# Patient Record
Sex: Female | Born: 1935 | Race: Black or African American | Hispanic: No | Smoking: Never smoker
Health system: Southern US, Community
[De-identification: ages and names within clinical notes are randomized; demographics above are authoritative.]

## PROBLEM LIST (undated history)

## (undated) DIAGNOSIS — I10 Essential (primary) hypertension: Secondary | ICD-10-CM

## (undated) DIAGNOSIS — M810 Age-related osteoporosis without current pathological fracture: Secondary | ICD-10-CM

## (undated) DIAGNOSIS — E079 Disorder of thyroid, unspecified: Secondary | ICD-10-CM

## (undated) DIAGNOSIS — E785 Hyperlipidemia, unspecified: Secondary | ICD-10-CM

## (undated) HISTORY — PX: TUBAL LIGATION: SHX77

## (undated) HISTORY — PX: KNEE SURGERY: SHX244

---

## 2004-07-26 ENCOUNTER — Ambulatory Visit: Payer: Self-pay | Admitting: Family Medicine

## 2005-12-17 ENCOUNTER — Ambulatory Visit: Payer: Self-pay | Admitting: Unknown Physician Specialty

## 2006-01-28 ENCOUNTER — Ambulatory Visit: Payer: Self-pay | Admitting: Family Medicine

## 2006-05-24 ENCOUNTER — Inpatient Hospital Stay: Payer: Self-pay | Admitting: Internal Medicine

## 2006-05-24 ENCOUNTER — Other Ambulatory Visit: Payer: Self-pay

## 2006-05-25 ENCOUNTER — Ambulatory Visit: Payer: Self-pay | Admitting: Internal Medicine

## 2006-06-07 ENCOUNTER — Ambulatory Visit: Payer: Self-pay | Admitting: Internal Medicine

## 2006-07-03 ENCOUNTER — Ambulatory Visit: Payer: Self-pay | Admitting: Internal Medicine

## 2006-07-07 ENCOUNTER — Ambulatory Visit: Payer: Self-pay | Admitting: Internal Medicine

## 2006-08-07 ENCOUNTER — Ambulatory Visit: Payer: Self-pay | Admitting: Internal Medicine

## 2006-08-10 ENCOUNTER — Ambulatory Visit: Payer: Self-pay | Admitting: Internal Medicine

## 2006-09-07 ENCOUNTER — Ambulatory Visit: Payer: Self-pay | Admitting: Internal Medicine

## 2007-03-04 ENCOUNTER — Ambulatory Visit: Payer: Self-pay | Admitting: Family Medicine

## 2007-07-02 ENCOUNTER — Ambulatory Visit: Payer: Self-pay | Admitting: Family Medicine

## 2008-03-06 ENCOUNTER — Ambulatory Visit: Payer: Self-pay | Admitting: Family Medicine

## 2009-04-18 ENCOUNTER — Ambulatory Visit: Payer: Self-pay | Admitting: Family Medicine

## 2010-05-08 ENCOUNTER — Ambulatory Visit: Payer: Self-pay | Admitting: Family Medicine

## 2011-01-27 ENCOUNTER — Emergency Department: Payer: Self-pay | Admitting: Emergency Medicine

## 2011-01-27 LAB — URINALYSIS, COMPLETE
Bacteria: NONE SEEN
Bilirubin,UR: NEGATIVE
Blood: NEGATIVE
Glucose,UR: NEGATIVE mg/dL (ref 0–75)
Hyaline Cast: 1
Ketone: NEGATIVE
Leukocyte Esterase: NEGATIVE
Nitrite: NEGATIVE
Ph: 5 (ref 4.5–8.0)
Protein: NEGATIVE
RBC,UR: 1 /HPF (ref 0–5)
Specific Gravity: 1.02 (ref 1.003–1.030)
Squamous Epithelial: 2
WBC UR: 1 /HPF (ref 0–5)

## 2011-01-27 LAB — CBC
HCT: 39.4 % (ref 35.0–47.0)
HGB: 12.8 g/dL (ref 12.0–16.0)
MCH: 29 pg (ref 26.0–34.0)
MCHC: 32.4 g/dL (ref 32.0–36.0)
MCV: 90 fL (ref 80–100)
Platelet: 157 10*3/uL (ref 150–440)
RBC: 4.4 10*6/uL (ref 3.80–5.20)
RDW: 16.5 % — ABNORMAL HIGH (ref 11.5–14.5)
WBC: 3.9 10*3/uL (ref 3.6–11.0)

## 2011-01-27 LAB — BASIC METABOLIC PANEL
Anion Gap: 9 (ref 7–16)
BUN: 6 mg/dL — ABNORMAL LOW (ref 7–18)
Calcium, Total: 8.4 mg/dL — ABNORMAL LOW (ref 8.5–10.1)
Chloride: 105 mmol/L (ref 98–107)
Co2: 25 mmol/L (ref 21–32)
Creatinine: 0.4 mg/dL — ABNORMAL LOW (ref 0.60–1.30)
EGFR (African American): >60
EGFR (Non-African Amer.): >60
Glucose: 76 mg/dL (ref 65–99)
Osmolality: 274 (ref 275–301)
Potassium: 4.2 mmol/L (ref 3.5–5.1)
Sodium: 139 mmol/L (ref 136–145)

## 2012-01-01 ENCOUNTER — Ambulatory Visit: Payer: Self-pay | Admitting: Family Medicine

## 2013-01-04 ENCOUNTER — Ambulatory Visit: Payer: Self-pay | Admitting: Family Medicine

## 2014-03-10 ENCOUNTER — Ambulatory Visit: Payer: Self-pay | Admitting: Family Medicine

## 2015-11-05 ENCOUNTER — Encounter: Payer: Self-pay | Admitting: Emergency Medicine

## 2015-11-05 ENCOUNTER — Inpatient Hospital Stay
Admission: EM | Admit: 2015-11-05 | Discharge: 2015-11-06 | DRG: 812 | Disposition: A | Discharge status: Home / Self Care | Payer: Medicare Other | Attending: Internal Medicine | Admitting: Internal Medicine

## 2015-11-05 DIAGNOSIS — E785 Hyperlipidemia, unspecified: Secondary | ICD-10-CM | POA: Diagnosis present

## 2015-11-05 DIAGNOSIS — D509 Iron deficiency anemia, unspecified: Secondary | ICD-10-CM | POA: Diagnosis present

## 2015-11-05 DIAGNOSIS — I10 Essential (primary) hypertension: Secondary | ICD-10-CM | POA: Diagnosis present

## 2015-11-05 DIAGNOSIS — E039 Hypothyroidism, unspecified: Secondary | ICD-10-CM | POA: Diagnosis present

## 2015-11-05 DIAGNOSIS — R42 Dizziness and giddiness: Secondary | ICD-10-CM | POA: Diagnosis present

## 2015-11-05 DIAGNOSIS — M810 Age-related osteoporosis without current pathological fracture: Secondary | ICD-10-CM | POA: Diagnosis present

## 2015-11-05 DIAGNOSIS — D649 Anemia, unspecified: Secondary | ICD-10-CM | POA: Diagnosis present

## 2015-11-05 HISTORY — DX: Age-related osteoporosis without current pathological fracture: M81.0

## 2015-11-05 HISTORY — DX: Hyperlipidemia, unspecified: E78.5

## 2015-11-05 HISTORY — DX: Essential (primary) hypertension: I10

## 2015-11-05 HISTORY — DX: Disorder of thyroid, unspecified: E07.9

## 2015-11-05 LAB — ABO/RH: ABO/RH(D): O NEG

## 2015-11-05 LAB — BASIC METABOLIC PANEL
ANION GAP: 3 — AB (ref 5–15)
BUN: 17 mg/dL (ref 6–20)
CHLORIDE: 107 mmol/L (ref 101–111)
CO2: 28 mmol/L (ref 22–32)
Calcium: 10.5 mg/dL — ABNORMAL HIGH (ref 8.9–10.3)
Creatinine, Ser: 0.64 mg/dL (ref 0.44–1.00)
GFR calc non Af Amer: >60 mL/min (ref >60)
Glucose, Bld: 108 mg/dL — ABNORMAL HIGH (ref 65–99)
POTASSIUM: 3.2 mmol/L — AB (ref 3.5–5.1)
Sodium: 138 mmol/L (ref 135–145)

## 2015-11-05 LAB — IRON AND TIBC
Iron: 11 ug/dL — ABNORMAL LOW (ref 28–170)
Saturation Ratios: 3 % — ABNORMAL LOW (ref 10.4–31.8)
TIBC: 422 ug/dL (ref 250–450)
UIBC: 411 ug/dL

## 2015-11-05 LAB — CBC
HCT: 24.7 % — ABNORMAL LOW (ref 35.0–47.0)
HEMOGLOBIN: 7.4 g/dL — AB (ref 12.0–16.0)
MCH: 20.1 pg — AB (ref 26.0–34.0)
MCHC: 29.9 g/dL — ABNORMAL LOW (ref 32.0–36.0)
MCV: 67.2 fL — AB (ref 80.0–100.0)
Platelets: 179 10*3/uL (ref 150–440)
RBC: 3.68 MIL/uL — AB (ref 3.80–5.20)
RDW: 20.4 % — ABNORMAL HIGH (ref 11.5–14.5)
WBC: 3.2 10*3/uL — AB (ref 3.6–11.0)

## 2015-11-05 LAB — LACTATE DEHYDROGENASE: LDH: 208 U/L — ABNORMAL HIGH (ref 98–192)

## 2015-11-05 LAB — FERRITIN: FERRITIN: 6 ng/mL — AB (ref 11–307)

## 2015-11-05 LAB — RETICULOCYTES
RBC.: 3.73 MIL/uL — ABNORMAL LOW (ref 3.80–5.20)
RETIC CT PCT: 1.5 % (ref 0.4–3.1)
Retic Count, Absolute: 56 10*3/uL (ref 19.0–183.0)

## 2015-11-05 LAB — PREPARE RBC (CROSSMATCH)

## 2015-11-05 LAB — FOLATE: Folate: 22 ng/mL (ref >5.9)

## 2015-11-05 LAB — TROPONIN I: Troponin I: <0.03 ng/mL (ref <0.03)

## 2015-11-05 MED ORDER — METOPROLOL SUCCINATE ER 50 MG PO TB24
50.0000 mg | ORAL_TABLET | Freq: Every day | ORAL | Status: DC
  Administered 2015-11-06: 50 mg via ORAL
  Filled 2015-11-05: qty 1

## 2015-11-05 MED ORDER — POTASSIUM CHLORIDE CRYS ER 20 MEQ PO TBCR
40.0000 meq | EXTENDED_RELEASE_TABLET | Freq: Once | ORAL | Status: AC
Start: 2015-11-05 — End: 2015-11-06
  Administered 2015-11-06: 40 meq via ORAL
  Filled 2015-11-05: qty 2

## 2015-11-05 MED ORDER — ALENDRONATE SODIUM 70 MG PO TABS: 70.0000 mg | ORAL_TABLET | ORAL | Status: DC

## 2015-11-05 MED ORDER — LISINOPRIL 40 MG PO TABS
40.0000 mg | ORAL_TABLET | Freq: Every day | ORAL | Status: DC
  Administered 2015-11-06: 40 mg via ORAL
  Filled 2015-11-05: qty 1

## 2015-11-05 MED ORDER — SIMVASTATIN 20 MG PO TABS
40.0000 mg | ORAL_TABLET | Freq: Every day | ORAL | Status: DC
  Administered 2015-11-06: 40 mg via ORAL
  Filled 2015-11-05: qty 2

## 2015-11-05 MED ORDER — ACETAMINOPHEN 325 MG PO TABS
650.0000 mg | ORAL_TABLET | Freq: Four times a day (QID) | ORAL | Status: DC | PRN
  Administered 2015-11-05: 650 mg via ORAL
  Filled 2015-11-05: qty 2

## 2015-11-05 MED ORDER — VITAMIN D 1000 UNITS PO TABS
2000.0000 [IU] | ORAL_TABLET | Freq: Every day | ORAL | Status: DC
  Administered 2015-11-06: 2000 [IU] via ORAL
  Filled 2015-11-05: qty 2

## 2015-11-05 MED ORDER — LEVOTHYROXINE SODIUM 75 MCG PO TABS
75.0000 ug | ORAL_TABLET | Freq: Every day | ORAL | Status: DC
  Administered 2015-11-06: 75 ug via ORAL
  Filled 2015-11-05: qty 1

## 2015-11-05 MED ORDER — ACETAMINOPHEN 650 MG RE SUPP
650.0000 mg | Freq: Four times a day (QID) | RECTAL | Status: DC | PRN
  Filled 2015-11-05: qty 1

## 2015-11-05 MED ORDER — SODIUM CHLORIDE 0.9 % IV SOLN
10.0000 mL/h | Freq: Once | INTRAVENOUS | Status: AC
  Administered 2015-11-05: 10 mL/h via INTRAVENOUS

## 2015-11-05 NOTE — ED Triage Notes (Signed)
Pt was at Dr and sent to the Ed. Hemoglobin results were low, pt was feeling dizzy earlier in the day. Lab work completed today

## 2015-11-05 NOTE — ED Provider Notes (Signed)
[facility]-EMERGENCY DEPARTMENT Provider Note   CSN: [id number] Arrival date & time: 11/05/15  1649     History   Chief Complaint Chief Complaint  Patient presents with  . Anemia    HPI Cristina Coleman is a 82 y.o. female hx of HL, HTN, hypothyroidism, here with near syncope, symptomatic anemia. Patient has a history of B12 deficiency. Has been lightheaded and dizzy for the last 2 weeks or so. States that is constantly getting worse. She denies any chest pain or shortness of breath. She denies any abdominal pain or black stools. Not currently on any blood thinners and no history of GI bleeds. Patient went to see primary care doctor and was noted to have a hemoglobin of 7.3. She was sent here for symptomatic anemia.    The history is provided by the patient.    Past Medical History:  Diagnosis Date  . Hyperlipidemia   . Hypertension   . Thyroid disease     There are no active problems to display for this patient.   Past Surgical History:  Procedure Laterality Date  . KNEE SURGERY    . TUBAL LIGATION      OB History    No data available       Home Medications    Prior to Admission medications   Not on File    Family History No family history on file.  Social History Social History  Substance Use Topics  . Smoking status: Never Smoker  . Smokeless tobacco: Never Used  . Alcohol use No     Allergies   Review of patient's allergies indicates no known allergies.   Review of Systems Review of Systems  Neurological: Positive for dizziness.  All other systems reviewed and are negative.    Physical Exam Updated Vital Signs BP (!) 176/91 (BP Location: Left Arm)   Pulse 89   Temp 98.1 F (36.7 C) (Oral)   Resp 16   Ht 5\' 3"  (1.6 m)   Wt 135 lb (61.2 kg)   SpO2 99%   BMI 23.91 kg/m   Physical Exam  Constitutional:  Chronically ill, pale   HENT:  Head: Normocephalic.  Eyes: EOM are normal. Pupils are equal, round, and reactive to light.    Conjunctiva pale   Neck: Normal range of motion. Neck supple.  Cardiovascular: Normal rate, regular rhythm and normal heart sounds.   Pulmonary/Chest: Effort normal and breath sounds normal. No respiratory distress. She has no wheezes. She has no rales.  Abdominal: Soft. Bowel sounds are normal. She exhibits no distension. There is no tenderness. There is no guarding.  Genitourinary:  Genitourinary Comments: occ neg, brown stool   Musculoskeletal: Normal range of motion.  Neurological: She is alert.  Skin: Skin is warm.  Psychiatric: She has a normal mood and affect.  Nursing note and vitals reviewed.    ED Treatments / Results  Labs (all labs ordered are listed, but only abnormal results are displayed) Labs Reviewed  BASIC METABOLIC PANEL - Abnormal; Notable for the following:       Result Value   Potassium 3.2 (*)    Glucose, Bld 108 (*)    Calcium 10.5 (*)    Anion gap 3 (*)    All other components within normal limits  CBC - Abnormal; Notable for the following:    WBC 3.2 (*)    RBC 3.68 (*)    Hemoglobin 7.4 (*)    HCT 24.7 (*)    MCV 67.2 (*)  MCH 20.1 (*)    MCHC 29.9 (*)    RDW 20.4 (*)    All other components within normal limits  URINALYSIS COMPLETEWITH MICROSCOPIC ([facility] ONLY)  VITAMIN B12  FOLATE  IRON AND TIBC  FERRITIN  RETICULOCYTES  TROPONIN I  CBG MONITORING, ED  TYPE AND SCREEN  PREPARE RBC (CROSSMATCH)    EKG  EKG Interpretation None       ED ECG REPORT I, Richardean Canalavid H Yao, the attending physician, personally viewed and interpreted this ECG.   Date: 11/05/2015  EKG Time: 18:46 pm  Rate: 72  Rhythm: normal EKG, normal sinus rhythm  Axis: normal  Intervals:left anterior fascicular block  ST&T Change: nonspecific    Radiology No results found.  Procedures Procedures (including critical care time)   CRITICAL CARE Performed by: Richardean Canalavid H Yao   Total critical care time: 30 minutes  Critical care time was exclusive of separately  billable procedures and treating other patients.  Critical care was necessary to treat or prevent imminent or life-threatening deterioration.  Critical care was time spent personally by me on the following activities: development of treatment plan with patient and/or surrogate as well as nursing, discussions with consultants, evaluation of patient's response to treatment, examination of patient, obtaining history from patient or surrogate, ordering and performing treatments and interventions, ordering and review of laboratory studies, ordering and review of radiographic studies, pulse oximetry and re-evaluation of patient's condition.   Medications Ordered in ED Medications  0.9 %  sodium chloride infusion (not administered)     Initial Impression / Assessment and Plan / ED Course  I have reviewed the triage vital signs and the nursing notes.  Pertinent labs & imaging results that were available during my care of the patient were reviewed by me and considered in my medical decision making (see chart for details).  Clinical Course    Cristina Coleman is a 82 y.o. female here with symptomatic anemia. Hg 7.4 in the ED. Occ negative, brown stool. Abdomen nontender. Has hx of B12 deficiency but has microcytic anemia currently. Since she is symptomatic, will order 1 U PRBC. Ordered anemia panel to evaluate for the cause of her anemia. Patient consented to blood transfusion. Will admit.    Final Clinical Impressions(s) / ED Diagnoses   Final diagnoses:  None    New Prescriptions New Prescriptions   No medications on file     Charlynne Panderavid Hsienta Yao, MD 11/05/15 1902

## 2015-11-05 NOTE — H&P (Signed)
[facility] [place] [facility]   PATIENT NAME: Cristina MayhewMary Schendel    MR#:  [id number]  DATE OF BIRTH:  06-25-1924  DATE OF ADMISSION:  11/05/2015  PRIMARY CARE PHYSICIAN: Dorothey BasemanAVID BRONSTEIN, MD   REQUESTING/REFERRING PHYSICIAN: Dr Chaney Mallingavid Yao  CHIEF COMPLAINT:   Chief Complaint  Patient presents with  . Anemia    HISTORY OF PRESENT ILLNESS:  Cristina Coleman  is a 82 y.o. female that went to see her primary care physician today because of dizziness and no energy. She had some laboratory data sent to the lab and they were called to come to the ER for blood transfusion. Patient does have occasional shortness of breath and weakness. The ER physician ordered a unit of blood for a hemoglobin of 7.3 for symptomatic anemia. Patient has no complaints of chest pain. The patient was guaiac negative in the ER.  PAST MEDICAL HISTORY:   Past Medical History:  Diagnosis Date  . Hyperlipidemia   . Hypertension   . Osteoporosis   . Thyroid disease     PAST SURGICAL HISTORY:   Past Surgical History:  Procedure Laterality Date  . KNEE SURGERY    . TUBAL LIGATION      SOCIAL HISTORY:   Social History  Substance Use Topics  . Smoking status: Never Smoker  . Smokeless tobacco: Never Used  . Alcohol use No    FAMILY HISTORY:   Family History  Problem Relation Age of Onset  . Ovarian cancer Mother     DRUG ALLERGIES:  No Known Allergies  REVIEW OF SYSTEMS:  CONSTITUTIONAL: No fever. Positive for weakness.  EYES: No blurred or double vision.  EARS, NOSE, AND THROAT: No tinnitus or ear pain. Positive for sore throat. Positive for runny nose. Occasional dysphasia RESPIRATORY: No cough. Positive for occasional shortness of breath. No wheezing or hemoptysis.  CARDIOVASCULAR: No chest pain, orthopnea, edema.  GASTROINTESTINAL: No nausea, vomiting, diarrhea or abdominal pain. No blood in bowel movements GENITOURINARY: No dysuria, hematuria.  ENDOCRINE: No  polyuria, nocturia,  HEMATOLOGY: History of anemia.  SKIN: No rash or lesion. MUSCULOSKELETAL: No joint pain or arthritis.   NEUROLOGIC: No tingling, numbness, weakness.  PSYCHIATRY: No anxiety or depression.   MEDICATIONS AT HOME:   Prior to Admission medications   Medication Sig Start Date End Date Taking? Authorizing Provider  alendronate (FOSAMAX) 70 MG tablet Take 1 tablet by mouth once a week. 10/30/15  Yes Historical Provider, MD  aspirin EC 81 MG tablet Take 81 mg by mouth daily as needed.   Yes Historical Provider, MD  Cholecalciferol (VITAMIN D) 2000 units tablet Take 1 tablet by mouth daily. 07/19/15 07/18/16 Yes Historical Provider, MD  levothyroxine (SYNTHROID, LEVOTHROID) 75 MCG tablet Take 1 tablet by mouth daily. 10/16/15  Yes Historical Provider, MD  lisinopril (PRINIVIL,ZESTRIL) 40 MG tablet Take 1 tablet by mouth daily. 10/29/15  Yes Historical Provider, MD  metoprolol succinate (TOPROL-XL) 50 MG 24 hr tablet Take 1 tablet by mouth daily. 10/17/15  Yes Historical Provider, MD  simvastatin (ZOCOR) 40 MG tablet Take 40 mg by mouth at bedtime. 10/16/15  Yes Historical Provider, MD      VITAL SIGNS:  Blood pressure (!) 169/75, pulse 73, temperature 98.1 F (36.7 C), temperature source Oral, resp. rate 19, height 5\' 3"  (1.6 m), weight 61.2 kg (135 lb), SpO2 100 %.  PHYSICAL EXAMINATION:  GENERAL:  82 y.o.-year-old patient lying in the bed with no acute distress.  EYES: Pupils equal, round, reactive to  light and accommodation. No scleral icterus. Extraocular muscles intact. Conjunctiva pale HEENT: Head atraumatic, normocephalic. Oropharynx and nasopharynx clear.  NECK:  Supple, no jugular venous distention. No thyroid enlargement, no tenderness.  LUNGS: Normal breath sounds bilaterally, no wheezing, rales,rhonchi or crepitation. No use of accessory muscles of respiration.  CARDIOVASCULAR: S1, S2 normal. 2/6 systolic murmurs. No rubs, or gallops.  ABDOMEN: Soft, nontender,  nondistended. Bowel sounds present. No organomegaly or mass.  EXTREMITIES: No pedal edema, cyanosis, or clubbing.  NEUROLOGIC: Cranial nerves II through XII are intact. Muscle strength 5/5 in all extremities. Sensation intact. Gait not checked.  PSYCHIATRIC: The patient is alert and oriented x 3.  SKIN: No rash, lesion, or ulcer.   LABORATORY PANEL:   CBC  Recent Labs Lab 11/05/15 1705  WBC 3.2*  HGB 7.4*  HCT 24.7*  PLT 179   ------------------------------------------------------------------------------------------------------------------  Chemistries   Recent Labs Lab 11/05/15 1705  NA 138  K 3.2*  CL 107  CO2 28  GLUCOSE 108*  BUN 17  CREATININE 0.64  CALCIUM 10.5*   ------------------------------------------------------------------------------------------------------------------    EKG:   Normal sinus rhythm 72 bpm, atrial premature complex, left anterior fascicular block  IMPRESSION AND PLAN:   1. Symptomatic anemia with hemoglobin of 7.3. Looking back at old labs the patient did have a hemoglobin of above 10 a few months ago. Patient guaiac negative in the ER. Iron studies sent off and still pending at this time. I will also send off a haptoglobin and LDH. Depending on where the ferritin level is will depend on whether the patient needs a GI workup or another workup. If the ferritin is very low can consider IV venofer. Patient advised to stop any aspirin or anti-inflammatories. 2. Essential hypertension. Continue her usual medications. Patient orthostatic in the ER. The unit of blood should help this. Continue to check orthostatics. 3. Hypothyroidism unspecified continue levothyroxine 4. Osteoporosis. On Fosamax as outpatient 5. Hyperlipidemia unspecified on simvastatin  All the records are reviewed and case discussed with ED provider. Management plans discussed with the patient, family and they are in agreement.  CODE STATUS: Full code  TOTAL TIME TAKING  CARE OF THIS PATIENT: 50 minutes.    Alford HighlandWIETING, Nayleah Gamel M.D on 11/05/2015 at 8:30 PM  Between 7am to 6pm - Pager - [phone]  After 6pm call admission pager [phone]  [facility] Office  [phone]  CC: Primary care physician; Dorothey BasemanAVID BRONSTEIN, MD

## 2015-11-06 LAB — CBC
HCT: 26.4 % — ABNORMAL LOW (ref 35.0–47.0)
HEMOGLOBIN: 8.2 g/dL — AB (ref 12.0–16.0)
MCH: 21.7 pg — ABNORMAL LOW (ref 26.0–34.0)
MCHC: 31.1 g/dL — ABNORMAL LOW (ref 32.0–36.0)
MCV: 69.8 fL — AB (ref 80.0–100.0)
PLATELETS: 153 10*3/uL (ref 150–440)
RBC: 3.78 MIL/uL — AB (ref 3.80–5.20)
RDW: 21.3 % — ABNORMAL HIGH (ref 11.5–14.5)
WBC: 3.3 10*3/uL — AB (ref 3.6–11.0)

## 2015-11-06 LAB — TYPE AND SCREEN
ABO/RH(D): O NEG
Antibody Screen: NEGATIVE
Unit division: 0

## 2015-11-06 LAB — BASIC METABOLIC PANEL
ANION GAP: 3 — AB (ref 5–15)
BUN: 14 mg/dL (ref 6–20)
CHLORIDE: 110 mmol/L (ref 101–111)
CO2: 27 mmol/L (ref 22–32)
CREATININE: 0.59 mg/dL (ref 0.44–1.00)
Calcium: 9.6 mg/dL (ref 8.9–10.3)
GFR calc non Af Amer: >60 mL/min (ref >60)
Glucose, Bld: 83 mg/dL (ref 65–99)
POTASSIUM: 3.3 mmol/L — AB (ref 3.5–5.1)
SODIUM: 140 mmol/L (ref 135–145)

## 2015-11-06 LAB — VITAMIN B12: Vitamin B-12: 252 pg/mL (ref 180–914)

## 2015-11-06 MED ORDER — FERROUS SULFATE 325 (65 FE) MG PO TBEC: 325.0000 mg | DELAYED_RELEASE_TABLET | Freq: Two times a day (BID) | ORAL | 0 refills | Status: DC

## 2015-11-06 NOTE — Discharge Instructions (Addendum)
Resume diet and activity as before ° ° °

## 2015-11-06 NOTE — Progress Notes (Signed)
Patient understands all discharge instructions and the need to make follow up appointments. Patient discharge via wheelchair with auxillary. 

## 2015-11-07 LAB — HAPTOGLOBIN: Haptoglobin: 160 mg/dL (ref 34–200)

## 2015-11-08 NOTE — Discharge Summary (Signed)
[facility] - [place] [facility]   PATIENT NAME: Cristina Coleman    MR#:  [id number]  DATE OF BIRTH:  [birth year]  DATE OF ADMISSION:  11/05/2015 ADMITTING PHYSICIAN: Alford Highlandichard Wieting, MD  DATE OF DISCHARGE: 11/06/2015 10:30 AM  PRIMARY CARE PHYSICIAN: Dorothey BasemanAVID BRONSTEIN, MD   ADMISSION DIAGNOSIS:  Anemia, unspecified type [D64.9]  DISCHARGE DIAGNOSIS:  Active Problems:   Symptomatic anemia   SECONDARY DIAGNOSIS:   Past Medical History:  Diagnosis Date  . Hyperlipidemia   . Hypertension   . Osteoporosis   . Thyroid disease      ADMITTING HISTORY  Cristina Coleman  is a 82 y.o. female that went to see her primary care physician today because of dizziness and no energy. She had some laboratory data sent to the lab and they were called to come to the ER for blood transfusion. Patient does have occasional shortness of breath and weakness. The ER physician ordered a unit of blood for a hemoglobin of 7.3 for symptomatic anemia. Patient has no complaints of chest pain. The patient was guaiac negative in the ER.  HOSPITAL COURSE:   * Microcytic anemia, symptomatic Etiology is unclear at this time. Stool was checked for blood and negative. One unit packed RBC transfused and hemoglobin improved. Patient feels significantly better. Patient will follow-up with her primary care physician for repeat labs. Started on iron supplements at discharge.  Stable for discharge home.  CONSULTS OBTAINED:    DRUG ALLERGIES:  No Known Allergies  DISCHARGE MEDICATIONS:   Discharge Medication List as of 11/06/2015  9:02 AM    START taking these medications   Details  ferrous sulfate 325 (65 FE) MG EC tablet Take 1 tablet (325 mg total) by mouth 2 (two) times daily with a meal., Starting Tue 11/06/2015, Normal      CONTINUE these medications which have NOT CHANGED   Details  alendronate (FOSAMAX) 70 MG tablet Take 1 tablet by mouth once a week., Starting Tue 10/30/2015,  Historical Med    aspirin EC 81 MG tablet Take 81 mg by mouth daily as needed., Historical Med    Cholecalciferol (VITAMIN D) 2000 units tablet Take 1 tablet by mouth daily., Starting Thu 07/19/2015, Until Fri 07/18/2016, Historical Med    levothyroxine (SYNTHROID, LEVOTHROID) 75 MCG tablet Take 1 tablet by mouth daily., Starting Tue 10/16/2015, Historical Med    lisinopril (PRINIVIL,ZESTRIL) 40 MG tablet Take 1 tablet by mouth daily., Starting Mon 10/29/2015, Historical Med    metoprolol succinate (TOPROL-XL) 50 MG 24 hr tablet Take 1 tablet by mouth daily., Starting Wed 10/17/2015, Historical Med    simvastatin (ZOCOR) 40 MG tablet Take 40 mg by mouth at bedtime., Starting Tue 10/16/2015, Historical Med        Today   VITAL SIGNS:  Blood pressure 130/70, pulse 84, temperature 98 F (36.7 C), resp. rate 18, height 5\' 3"  (1.6 m), weight 61.2 kg (135 lb), SpO2 99 %.  I/O:  No intake or output data in the 24 hours ending 11/08/15 1559  PHYSICAL EXAMINATION:  Physical Exam  GENERAL:  82 y.o.-year-old patient lying in the bed with no acute distress.  LUNGS: Normal breath sounds bilaterally, no wheezing, rales,rhonchi or crepitation. No use of accessory muscles of respiration.  CARDIOVASCULAR: S1, S2 normal. No murmurs, rubs, or gallops.  ABDOMEN: Soft, non-tender, non-distended. Bowel sounds present. No organomegaly or mass.  NEUROLOGIC: Moves all 4 extremities. PSYCHIATRIC: The patient is alert and oriented x 3.  SKIN: No obvious  rash, lesion, or ulcer.   DATA REVIEW:   CBC  Recent Labs Lab 11/06/15 0621  WBC 3.3*  HGB 8.2*  HCT 26.4*  PLT 153    Chemistries   Recent Labs Lab 11/06/15 0621  NA 140  K 3.3*  CL 110  CO2 27  GLUCOSE 83  BUN 14  CREATININE 0.59  CALCIUM 9.6    Cardiac Enzymes  Recent Labs Lab 11/05/15 1856  TROPONINI <0.03    Microbiology Results  No results found for this or any previous visit.  RADIOLOGY:  No results  found.  Follow up with PCP in 1 week.  Management plans discussed with the patient, family and they are in agreement.  CODE STATUS:  Code Status History    Date Active Date Inactive Code Status Order ID Comments User Context   11/05/2015  8:25 PM 11/06/2015  1:35 PM Full Code [id number]  Alford Highlandichard Wieting, MD ED      TOTAL TIME TAKING CARE OF THIS PATIENT ON DAY OF DISCHARGE: more than 30 minutes.   Milagros LollSudini, Talulah Schirmer R M.D on 11/08/2015 at 3:59 PM  Between 7am to 6pm - Pager - [phone]  After 6pm go to [email or web address] - password [email or web address] [email or web address]  [facility]  Office  [phone]  CC: Primary care physician; Dorothey BasemanAVID BRONSTEIN, MD  Note: This dictation was prepared with Dragon dictation along with smaller phrase technology. Any transcriptional errors that result from this process are unintentional.

## 2015-11-26 ENCOUNTER — Emergency Department
Admission: EM | Admit: 2015-11-26 | Discharge: 2015-11-26 | Payer: Medicare Other | Attending: Emergency Medicine | Admitting: Emergency Medicine

## 2015-11-26 ENCOUNTER — Encounter: Payer: Self-pay | Admitting: Emergency Medicine

## 2015-11-26 ENCOUNTER — Emergency Department: Payer: Medicare Other

## 2015-11-26 DIAGNOSIS — W19XXXA Unspecified fall, initial encounter: Secondary | ICD-10-CM | POA: Diagnosis not present

## 2015-11-26 DIAGNOSIS — I10 Essential (primary) hypertension: Secondary | ICD-10-CM | POA: Insufficient documentation

## 2015-11-26 DIAGNOSIS — S065X0A Traumatic subdural hemorrhage without loss of consciousness, initial encounter: Secondary | ICD-10-CM | POA: Insufficient documentation

## 2015-11-26 DIAGNOSIS — Y929 Unspecified place or not applicable: Secondary | ICD-10-CM | POA: Diagnosis not present

## 2015-11-26 DIAGNOSIS — E876 Hypokalemia: Secondary | ICD-10-CM | POA: Diagnosis not present

## 2015-11-26 DIAGNOSIS — S065X9A Traumatic subdural hemorrhage with loss of consciousness of unspecified duration, initial encounter: Secondary | ICD-10-CM

## 2015-11-26 DIAGNOSIS — Y939 Activity, unspecified: Secondary | ICD-10-CM | POA: Diagnosis not present

## 2015-11-26 DIAGNOSIS — Z79899 Other long term (current) drug therapy: Secondary | ICD-10-CM | POA: Insufficient documentation

## 2015-11-26 DIAGNOSIS — Z7982 Long term (current) use of aspirin: Secondary | ICD-10-CM | POA: Insufficient documentation

## 2015-11-26 DIAGNOSIS — Y999 Unspecified external cause status: Secondary | ICD-10-CM | POA: Insufficient documentation

## 2015-11-26 DIAGNOSIS — R270 Ataxia, unspecified: Secondary | ICD-10-CM

## 2015-11-26 DIAGNOSIS — S0990XA Unspecified injury of head, initial encounter: Secondary | ICD-10-CM | POA: Diagnosis present

## 2015-11-26 DIAGNOSIS — S065XAA Traumatic subdural hemorrhage with loss of consciousness status unknown, initial encounter: Secondary | ICD-10-CM

## 2015-11-26 LAB — CBC
HCT: 37.1 % (ref 35.0–47.0)
HEMOGLOBIN: 11.5 g/dL — AB (ref 12.0–16.0)
MCH: 23.9 pg — AB (ref 26.0–34.0)
MCHC: 31 g/dL — AB (ref 32.0–36.0)
MCV: 77 fL — AB (ref 80.0–100.0)
Platelets: 155 10*3/uL (ref 150–440)
RBC: 4.83 MIL/uL (ref 3.80–5.20)
RDW: 31.3 % — ABNORMAL HIGH (ref 11.5–14.5)
WBC: 4 10*3/uL (ref 3.6–11.0)

## 2015-11-26 LAB — BASIC METABOLIC PANEL
ANION GAP: 8 (ref 5–15)
BUN: 16 mg/dL (ref 6–20)
CALCIUM: 10.4 mg/dL — AB (ref 8.9–10.3)
CO2: 27 mmol/L (ref 22–32)
Chloride: 107 mmol/L (ref 101–111)
Creatinine, Ser: 0.7 mg/dL (ref 0.44–1.00)
GFR calc Af Amer: >60 mL/min (ref >60)
GFR calc non Af Amer: >60 mL/min (ref >60)
GLUCOSE: 130 mg/dL — AB (ref 65–99)
Potassium: 2.6 mmol/L — CL (ref 3.5–5.1)
Sodium: 142 mmol/L (ref 135–145)

## 2015-11-26 LAB — URINALYSIS COMPLETE WITH MICROSCOPIC (ARMC ONLY)
Glucose, UA: NEGATIVE mg/dL
Hgb urine dipstick: NEGATIVE
KETONES UR: NEGATIVE mg/dL
Nitrite: NEGATIVE
PROTEIN: 100 mg/dL — AB
Specific Gravity, Urine: 1.029 (ref 1.005–1.030)
pH: 5 (ref 5.0–8.0)

## 2015-11-26 MED ORDER — SODIUM CHLORIDE 0.9 % IV BOLUS (SEPSIS)
500.0000 mL | Freq: Once | INTRAVENOUS | Status: AC
  Administered 2015-11-26: 500 mL via INTRAVENOUS

## 2015-11-26 MED ORDER — POTASSIUM CHLORIDE CRYS ER 20 MEQ PO TBCR
40.0000 meq | EXTENDED_RELEASE_TABLET | Freq: Once | ORAL | Status: AC
  Administered 2015-11-26: 40 meq via ORAL
  Filled 2015-11-26: qty 2

## 2015-11-26 NOTE — ED Notes (Signed)
Potassium of 2.6 reported to Dr Derrill KayGoodman.

## 2015-11-26 NOTE — ED Provider Notes (Signed)
[facility] Emergency Department Provider Note  ____________________________________________  Time seen: Approximately 6:52 PM  I have reviewed the triage vital signs and the nursing notes.   HISTORY  Chief Complaint Weakness    HPI Cristina Coleman is a 82 y.o. female who complains of feeling off balance and weak in the right leg, gradual onset over the past week. A few days before this happened, the patient had a fall and was unsure if she hit her head. She denies any headache or vision changes. Doesn't take any blood thinners. Was seen by her PCP today who advised her to come to the ED to be evaluated for stroke. Denies chest pain dizziness or fever.   Past Medical History:  Diagnosis Date  . Hyperlipidemia   . Hypertension   . Osteoporosis   . Thyroid disease      Patient Active Problem List   Diagnosis Date Noted  . Symptomatic anemia 11/05/2015     Past Surgical History:  Procedure Laterality Date  . KNEE SURGERY    . TUBAL LIGATION       Prior to Admission medications   Medication Sig Start Date End Date Taking? Authorizing Provider  alendronate (FOSAMAX) 70 MG tablet Take 1 tablet by mouth once a week. 10/30/15   Historical Provider, MD  aspirin EC 81 MG tablet Take 81 mg by mouth daily as needed.    Historical Provider, MD  Cholecalciferol (VITAMIN D) 2000 units tablet Take 1 tablet by mouth daily. 07/19/15 07/18/16  Historical Provider, MD  ferrous sulfate 325 (65 FE) MG EC tablet Take 1 tablet (325 mg total) by mouth 2 (two) times daily with a meal. 11/06/15   Srikar Sudini, MD  levothyroxine (SYNTHROID, LEVOTHROID) 75 MCG tablet Take 1 tablet by mouth daily. 10/16/15   Historical Provider, MD  lisinopril (PRINIVIL,ZESTRIL) 40 MG tablet Take 1 tablet by mouth daily. 10/29/15   Historical Provider, MD  metoprolol succinate (TOPROL-XL) 50 MG 24 hr tablet Take 1 tablet by mouth daily. 10/17/15   Historical Provider, MD  simvastatin  (ZOCOR) 40 MG tablet Take 40 mg by mouth at bedtime. 10/16/15   Historical Provider, MD     Allergies Patient has no known allergies.   Family History  Problem Relation Age of Onset  . Ovarian cancer Mother     Social History Social History  Substance Use Topics  . Smoking status: Never Smoker  . Smokeless tobacco: Never Used  . Alcohol use No    Review of Systems  Constitutional:   No fever or chills.  ENT:   No sore throat. No rhinorrhea. Cardiovascular:   No chest pain. Respiratory:   No dyspnea or cough. Gastrointestinal:   Negative for abdominal pain, vomiting and diarrhea.  Genitourinary:   Negative for dysuria or difficulty urinating. Musculoskeletal:   Negative for focal pain or swelling Neurological:   Negative for headaches. Positive right leg weakness and feeling off balance 10-point ROS otherwise negative.  ____________________________________________   PHYSICAL EXAM:  VITAL SIGNS: ED Triage Vitals  Enc Vitals Group     BP 11/26/15 1521 (!) 146/74     Pulse Rate 11/26/15 1521 86     Resp 11/26/15 1521 18     Temp 11/26/15 1521 97.8 F (36.6 C)     Temp Source 11/26/15 1521 Oral     SpO2 11/26/15 1521 98 %     Weight 11/26/15 1522 135 lb (61.2 kg)     Height 11/26/15 1522 5'  2" (1.575 m)     Head Circumference --      Peak Flow --      Pain Score --      Pain Loc --      Pain Edu? --      Excl. in GC? --     Vital signs reviewed, nursing assessments reviewed.   Constitutional:   Alert and oriented. Not in distress. Eyes:   No scleral icterus. No conjunctival pallor. PERRL. EOMI.  No nystagmus. ENT   Head:   Normocephalic and atraumatic.   Nose:   No congestion/rhinnorhea. No septal hematoma   Mouth/Throat:   MMM, no pharyngeal erythema. No peritonsillar mass.    Neck:   No stridor. No SubQ emphysema. No meningismus. Hematological/Lymphatic/Immunilogical:   No cervical lymphadenopathy. Cardiovascular:   RRR. Symmetric  bilateral radial and DP pulses.  No murmurs.  Respiratory:   Normal respiratory effort without tachypnea nor retractions. Breath sounds are clear and equal bilaterally. No wheezes/rales/rhonchi. Gastrointestinal:   Soft and nontender. Non distended. There is no CVA tenderness.  No rebound, rigidity, or guarding. Genitourinary:   deferred Musculoskeletal:   Nontender with normal range of motion in all extremities. No joint effusions.  No lower extremity tenderness.  No edema. Neurologic:   Normal speech and language.  CN 2-10 normal. Motor grossly intact. No pronator drift. Upper external restraint normal. Lower extremity strength normal and symmetric Positive Ataxic gait Skin:    Skin is warm, dry and intact. No rash noted.  No petechiae, purpura, or bullae.  ____________________________________________    LABS (pertinent positives/negatives) (all labs ordered are listed, but only abnormal results are displayed) Labs Reviewed  BASIC METABOLIC PANEL - Abnormal; Notable for the following:       Result Value   Potassium 2.6 (*)    Glucose, Bld 130 (*)    Calcium 10.4 (*)    All other components within normal limits  CBC - Abnormal; Notable for the following:    Hemoglobin 11.5 (*)    MCV 77.0 (*)    MCH 23.9 (*)    MCHC 31.0 (*)    RDW 31.3 (*)    All other components within normal limits  URINALYSIS COMPLETEWITH MICROSCOPIC ([facility] ONLY) - Abnormal; Notable for the following:    Color, Urine AMBER (*)    APPearance HAZY (*)    Bilirubin Urine 1+ (*)    Protein, ur 100 (*)    Leukocytes, UA 1+ (*)    Bacteria, UA RARE (*)    Squamous Epithelial / LPF 6-30 (*)    All other components within normal limits  CBG MONITORING, ED   ____________________________________________   EKG  Interpreted by me Normal sinus rhythm rate of 83, left axis, normal intervals. Poor R-wave progression in anterior precordial leads. Voltage criteria for LVH in the high lateral leads. Normal ST  segments. T wave inversions in 3 and aVF and V3 which are unchanged from 05/24/2006.  ____________________________________________    RADIOLOGY  CT head reveals bilateral subdural hematomas with acute on chronic bleeding with 1-2 mm of shift to the right. Also some sequelae of old strokes. Results were discussed with the radiologist.  ____________________________________________   PROCEDURES Procedures CRITICAL CARE Performed by: Scotty Court, Dezman Granda   Total critical care time: 35 minutes  Critical care time was exclusive of separately billable procedures and treating other patients.  Critical care was necessary to treat or prevent imminent or life-threatening deterioration.  Critical care was time spent personally by  me on the following activities: development of treatment plan with patient and/or surrogate as well as nursing, discussions with consultants, evaluation of patient's response to treatment, examination of patient, obtaining history from patient or surrogate, ordering and performing treatments and interventions, ordering and review of laboratory studies, ordering and review of radiographic studies, pulse oximetry and re-evaluation of patient's condition.  ____________________________________________   INITIAL IMPRESSION / ASSESSMENT AND PLAN / ED COURSE  Pertinent labs & imaging results that were available during my care of the patient were reviewed by me and considered in my medical decision making (see chart for details).  Patient presents with ataxia after a fall and possible head injury over a week ago. Very gradual onset of symptoms. Due to the neurologic finding, CT scan was obtained which does show bilateral subdural hematomas which likely have been continuously bleeding at a slow rate and are now causing enough mass effect that the patient is becoming symptomatic. Rest of the workup was unremarkable except for some hypokalemia rigidity is currently unrelated to her  symptoms.  [facility] transfer center contacted at about 6:45 PM to arrange for transfer to neurosurgery.    ----------------------------------------- 7:53 PM on 11/26/2015 -----------------------------------------  [facility] Faye RamsayBowmick who recommends ED the ED transfer to the neurosurgery can consult on the patient and evaluate. [facility]   Clinical Course    ____________________________________________   FINAL CLINICAL IMPRESSION(S) / ED DIAGNOSES  Final diagnoses:  Bilateral subdural hematomas (HCC)  Ataxia  Hypokalemia       Portions of this note were generated with dragon dictation software. Dictation errors may occur despite best attempts at proofreading.    Sharman CheekPhillip Evona Westra, MD 11/26/15 [phone]

## 2015-11-26 NOTE — ED Notes (Signed)
Patient transported to CT 

## 2015-11-26 NOTE — ED Triage Notes (Signed)
Patient presents to the ED with right sided weakness x 2 weeks per patient and family and right knee pain.  Patient states, "I've been hurting some on my right side."  Patient is alert and oriented to self only at this time.  Patient usually lives at home with her son and family helps to take care of her.

## 2016-10-27 ENCOUNTER — Other Ambulatory Visit: Payer: Self-pay | Admitting: Student

## 2016-10-27 DIAGNOSIS — R319 Hematuria, unspecified: Secondary | ICD-10-CM

## 2016-10-27 DIAGNOSIS — N950 Postmenopausal bleeding: Secondary | ICD-10-CM

## 2016-10-29 ENCOUNTER — Ambulatory Visit
Admission: RE | Admit: 2016-10-29 | Discharge: 2016-10-29 | Disposition: A | Discharge status: Home / Self Care | Payer: Medicare Other | Source: Ambulatory Visit | Attending: Student | Admitting: Student

## 2016-10-29 DIAGNOSIS — R319 Hematuria, unspecified: Secondary | ICD-10-CM | POA: Insufficient documentation

## 2016-10-29 DIAGNOSIS — R9389 Abnormal findings on diagnostic imaging of other specified body structures: Secondary | ICD-10-CM | POA: Diagnosis not present

## 2016-10-29 DIAGNOSIS — N950 Postmenopausal bleeding: Secondary | ICD-10-CM | POA: Insufficient documentation

## 2016-12-31 ENCOUNTER — Emergency Department: Payer: Medicare Other

## 2016-12-31 ENCOUNTER — Other Ambulatory Visit: Payer: Self-pay

## 2016-12-31 ENCOUNTER — Inpatient Hospital Stay: Payer: Medicare Other

## 2016-12-31 ENCOUNTER — Encounter: Payer: Self-pay | Admitting: Emergency Medicine

## 2016-12-31 ENCOUNTER — Inpatient Hospital Stay
Admission: EM | Admit: 2016-12-31 | Discharge: 2017-01-02 | DRG: 065 | Disposition: A | Discharge status: Hospice | Payer: Medicare Other | Attending: Internal Medicine | Admitting: Internal Medicine

## 2016-12-31 DIAGNOSIS — E538 Deficiency of other specified B group vitamins: Secondary | ICD-10-CM | POA: Diagnosis present

## 2016-12-31 DIAGNOSIS — R531 Weakness: Secondary | ICD-10-CM

## 2016-12-31 DIAGNOSIS — Z7982 Long term (current) use of aspirin: Secondary | ICD-10-CM | POA: Diagnosis not present

## 2016-12-31 DIAGNOSIS — M25552 Pain in left hip: Secondary | ICD-10-CM | POA: Diagnosis present

## 2016-12-31 DIAGNOSIS — Z8041 Family history of malignant neoplasm of ovary: Secondary | ICD-10-CM | POA: Diagnosis not present

## 2016-12-31 DIAGNOSIS — I639 Cerebral infarction, unspecified: Principal | ICD-10-CM | POA: Diagnosis present

## 2016-12-31 DIAGNOSIS — M436 Torticollis: Secondary | ICD-10-CM | POA: Diagnosis not present

## 2016-12-31 DIAGNOSIS — E860 Dehydration: Secondary | ICD-10-CM | POA: Diagnosis present

## 2016-12-31 DIAGNOSIS — R197 Diarrhea, unspecified: Secondary | ICD-10-CM | POA: Diagnosis present

## 2016-12-31 DIAGNOSIS — Z7983 Long term (current) use of bisphosphonates: Secondary | ICD-10-CM | POA: Diagnosis not present

## 2016-12-31 DIAGNOSIS — R4182 Altered mental status, unspecified: Secondary | ICD-10-CM | POA: Diagnosis present

## 2016-12-31 DIAGNOSIS — L8990 Pressure ulcer of unspecified site, unspecified stage: Secondary | ICD-10-CM

## 2016-12-31 DIAGNOSIS — E785 Hyperlipidemia, unspecified: Secondary | ICD-10-CM | POA: Diagnosis present

## 2016-12-31 DIAGNOSIS — G9349 Other encephalopathy: Secondary | ICD-10-CM | POA: Diagnosis present

## 2016-12-31 DIAGNOSIS — M810 Age-related osteoporosis without current pathological fracture: Secondary | ICD-10-CM | POA: Diagnosis present

## 2016-12-31 DIAGNOSIS — E870 Hyperosmolality and hypernatremia: Secondary | ICD-10-CM | POA: Diagnosis present

## 2016-12-31 DIAGNOSIS — N179 Acute kidney failure, unspecified: Secondary | ICD-10-CM | POA: Diagnosis present

## 2016-12-31 DIAGNOSIS — Z66 Do not resuscitate: Secondary | ICD-10-CM | POA: Diagnosis present

## 2016-12-31 DIAGNOSIS — Z515 Encounter for palliative care: Secondary | ICD-10-CM | POA: Diagnosis present

## 2016-12-31 DIAGNOSIS — R627 Adult failure to thrive: Secondary | ICD-10-CM | POA: Diagnosis present

## 2016-12-31 DIAGNOSIS — I10 Essential (primary) hypertension: Secondary | ICD-10-CM | POA: Diagnosis present

## 2016-12-31 DIAGNOSIS — E039 Hypothyroidism, unspecified: Secondary | ICD-10-CM | POA: Diagnosis present

## 2016-12-31 DIAGNOSIS — F0390 Unspecified dementia without behavioral disturbance: Secondary | ICD-10-CM | POA: Diagnosis present

## 2016-12-31 LAB — CBC
HCT: 47 % (ref 35.0–47.0)
Hemoglobin: 15.1 g/dL (ref 12.0–16.0)
MCH: 30.4 pg (ref 26.0–34.0)
MCHC: 32.1 g/dL (ref 32.0–36.0)
MCV: 94.8 fL (ref 80.0–100.0)
Platelets: 87 10*3/uL — ABNORMAL LOW (ref 150–440)
RBC: 4.96 MIL/uL (ref 3.80–5.20)
RDW: 15.7 % — AB (ref 11.5–14.5)
WBC: 12.8 10*3/uL — ABNORMAL HIGH (ref 3.6–11.0)

## 2016-12-31 LAB — TROPONIN I: Troponin I: 0.04 ng/mL (ref <0.03)

## 2016-12-31 LAB — URINALYSIS, ROUTINE W REFLEX MICROSCOPIC
BACTERIA UA: NONE SEEN
BILIRUBIN URINE: NEGATIVE
Glucose, UA: NEGATIVE mg/dL
Hgb urine dipstick: NEGATIVE
Ketones, ur: NEGATIVE mg/dL
Leukocytes, UA: NEGATIVE
Nitrite: NEGATIVE
PROTEIN: 30 mg/dL — AB
SPECIFIC GRAVITY, URINE: 1.026 (ref 1.005–1.030)
pH: 5 (ref 5.0–8.0)

## 2016-12-31 LAB — TSH: TSH: 2.768 u[IU]/mL (ref 0.350–4.500)

## 2016-12-31 LAB — BLOOD GAS, VENOUS
ACID-BASE EXCESS: 5.5 mmol/L — AB (ref 0.0–2.0)
BICARBONATE: 30.6 mmol/L — AB (ref 20.0–28.0)
O2 SAT: 86.5 %
PATIENT TEMPERATURE: 37
PCO2 VEN: 45 mmHg (ref 44.0–60.0)
PO2 VEN: 50 mmHg — AB (ref 32.0–45.0)
pH, Ven: 7.44 — ABNORMAL HIGH (ref 7.250–7.430)

## 2016-12-31 LAB — COMPREHENSIVE METABOLIC PANEL
ALBUMIN: 3.1 g/dL — AB (ref 3.5–5.0)
ALK PHOS: 87 U/L (ref 38–126)
ALT: 12 U/L — ABNORMAL LOW (ref 14–54)
ANION GAP: 8 (ref 5–15)
AST: 17 U/L (ref 15–41)
BUN: 66 mg/dL — AB (ref 6–20)
CO2: 27 mmol/L (ref 22–32)
Calcium: 11.3 mg/dL — ABNORMAL HIGH (ref 8.9–10.3)
Chloride: 111 mmol/L (ref 101–111)
Creatinine, Ser: 1.22 mg/dL — ABNORMAL HIGH (ref 0.44–1.00)
GFR calc Af Amer: 44 mL/min — ABNORMAL LOW (ref >60)
GFR calc non Af Amer: 38 mL/min — ABNORMAL LOW (ref >60)
GLUCOSE: 117 mg/dL — AB (ref 65–99)
POTASSIUM: 3.5 mmol/L (ref 3.5–5.1)
SODIUM: 146 mmol/L — AB (ref 135–145)
Total Bilirubin: 0.9 mg/dL (ref 0.3–1.2)
Total Protein: 7.9 g/dL (ref 6.5–8.1)

## 2016-12-31 LAB — VITAMIN B12: Vitamin B-12: 531 pg/mL (ref 180–914)

## 2016-12-31 LAB — LACTIC ACID, PLASMA
LACTIC ACID, VENOUS: 1.6 mmol/L (ref 0.5–1.9)
Lactic Acid, Venous: 2.6 mmol/L (ref 0.5–1.9)

## 2016-12-31 LAB — AMMONIA: Ammonia: 18 umol/L (ref 9–35)

## 2016-12-31 LAB — VALPROIC ACID LEVEL

## 2016-12-31 MED ORDER — ACETAMINOPHEN 325 MG PO TABS
650.0000 mg | ORAL_TABLET | Freq: Four times a day (QID) | ORAL | Status: DC | PRN
Start: 2016-12-31 — End: 2017-01-02

## 2016-12-31 MED ORDER — SENNOSIDES-DOCUSATE SODIUM 8.6-50 MG PO TABS: 1.0000 | ORAL_TABLET | Freq: Every evening | ORAL | Status: DC | PRN

## 2016-12-31 MED ORDER — SODIUM CHLORIDE 0.9 % IV SOLN
INTRAVENOUS | Status: DC
  Administered 2016-12-31 – 2017-01-01 (×2): via INTRAVENOUS

## 2016-12-31 MED ORDER — STROKE: EARLY STAGES OF RECOVERY BOOK
Freq: Once | Status: AC
  Administered 2016-12-31

## 2016-12-31 MED ORDER — ONDANSETRON HCL 4 MG PO TABS: 4.0000 mg | ORAL_TABLET | Freq: Four times a day (QID) | ORAL | Status: DC | PRN

## 2016-12-31 MED ORDER — TRAMADOL HCL 50 MG PO TABS: 50.0000 mg | ORAL_TABLET | Freq: Four times a day (QID) | ORAL | Status: DC | PRN

## 2016-12-31 MED ORDER — LISINOPRIL 20 MG PO TABS
40.0000 mg | ORAL_TABLET | Freq: Every day | ORAL | Status: DC
  Administered 2017-01-01: 40 mg via ORAL
  Filled 2016-12-31: qty 2

## 2016-12-31 MED ORDER — SODIUM CHLORIDE 0.9 % IV BOLUS (SEPSIS)
1000.0000 mL | Freq: Once | INTRAVENOUS | Status: AC
  Administered 2016-12-31: 1000 mL via INTRAVENOUS

## 2016-12-31 MED ORDER — ACETAMINOPHEN 650 MG RE SUPP
650.0000 mg | Freq: Four times a day (QID) | RECTAL | Status: DC | PRN
Start: 2016-12-31 — End: 2017-01-02

## 2016-12-31 MED ORDER — ONDANSETRON HCL 4 MG/2ML IJ SOLN: 4.0000 mg | Freq: Four times a day (QID) | INTRAMUSCULAR | Status: DC | PRN

## 2016-12-31 MED ORDER — LEVOTHYROXINE SODIUM 50 MCG PO TABS: 75.0000 ug | ORAL_TABLET | Freq: Every day | ORAL | Status: DC

## 2016-12-31 MED ORDER — SODIUM CHLORIDE 0.9 % IV BOLUS (SEPSIS)
500.0000 mL | Freq: Once | INTRAVENOUS | Status: AC
  Administered 2016-12-31: 500 mL via INTRAVENOUS

## 2016-12-31 NOTE — Progress Notes (Signed)
CRITICAL VALUE ALERT  Critical Value:  2.6  Date & Time Notied:  12/31/2016 1856  Provider Notified: Dr. Katheren ShamsSalary  Orders Received/Actions taken: see new orders  Orson Apeanielle Osmara Drummonds, RN

## 2016-12-31 NOTE — Plan of Care (Addendum)
  Patient has 2 sons. 1 has brain injury and one of his aunts are his legal guardian. The other son is in rehabilitation at this time. He patient has 3 sisters but 2 are in very poor health. Per patient's niece, patient's younger sister and the 2 nieces will come tomorrow at 1:00 for a GOC conversation.   No charge.

## 2016-12-31 NOTE — ED Notes (Signed)
Patient O2 saturation at 88% on room air. Patient placed on 2L [place] and oxygenation at 98%.

## 2016-12-31 NOTE — ED Triage Notes (Signed)
Arrives from home via [facility].  Family report steady decline in status x 4 days.  Patient has had diarrhea x 3-4 days.  Family report decreased PO intake.  VS wnl.  CBG 147.

## 2016-12-31 NOTE — ED Provider Notes (Signed)
[facility] Emergency Department Provider Note  Time seen: 11:21 AM  I have reviewed the triage vital signs and the nursing notes.   HISTORY  Chief Complaint Diarrhea and Failure To Thrive    HPI Evangeline GulaMary C Buscemi is a 83 y.o. female with a past medical history of hypertension, hyperlipidemia, presents to the emergency department for altered mental status.  According to EMS per family they state the patient is normally able to converse, has gone to her recent PCP appointment 1 week ago with normal lab work.  States over the past 4 days she has become increasingly less responsive and less interactive.  Today the patient is not talking or responding to commands.  Patient lives at home with family.  In the emergency department the patient is awake, alert will make eye contact but will not answer questions or follow commands.   Past Medical History:  Diagnosis Date  . Hyperlipidemia   . Hypertension   . Osteoporosis   . Thyroid disease     Patient Active Problem List   Diagnosis Date Noted  . Symptomatic anemia 11/05/2015    Past Surgical History:  Procedure Laterality Date  . KNEE SURGERY    . TUBAL LIGATION      Prior to Admission medications   Medication Sig Start Date End Date Taking? Authorizing Provider  atorvastatin (LIPITOR) 20 MG tablet Take 1 tablet by mouth daily. 10/23/16  Yes [provider]  alendronate (FOSAMAX) 70 MG tablet Take 1 tablet by mouth once a week. 10/30/15   [provider]  aspirin EC 81 MG tablet Take 81 mg by mouth daily as needed.    [provider]  divalproex (DEPAKOTE SPRINKLE) 125 MG capsule Take 1 capsule by mouth 2 (two) times daily. 12/24/16   [provider]  ferrous sulfate 325 (65 FE) MG EC tablet Take 1 tablet (325 mg total) by mouth 2 (two) times daily with a meal. 11/06/15   Sudini, Srikar, MD  hydrochlorothiazide (MICROZIDE) 12.5 MG capsule Take 1 capsule by mouth daily.  12/24/16   [provider]  levothyroxine (SYNTHROID, LEVOTHROID) 75 MCG tablet Take 1 tablet by mouth daily. 10/16/15   [provider]  lisinopril (PRINIVIL,ZESTRIL) 40 MG tablet Take 1 tablet by mouth daily. 10/29/15   [provider]  metoprolol succinate (TOPROL-XL) 50 MG 24 hr tablet Take 1 tablet by mouth daily. 10/17/15   [provider]  potassium chloride (MICRO-K) 10 MEQ CR capsule Take 1 capsule by mouth daily. 12/24/16   [provider]  QUEtiapine (SEROQUEL) 25 MG tablet Take 1 tablet by mouth 2 (two) times daily. 12/24/16   [provider]  simvastatin (ZOCOR) 40 MG tablet Take 40 mg by mouth at bedtime. 10/16/15   [provider]    No Known Allergies  Family History  Problem Relation Age of Onset  . Ovarian cancer Mother     Social History Social History   Tobacco Use  . Smoking status: Never Smoker  . Smokeless tobacco: Never Used  Substance Use Topics  . Alcohol use: No  . Drug use: No    Review of Systems Unable to obtain adequate/accurate review of systems due to altered mental status  ____________________________________________   PHYSICAL EXAM:  Constitutional: Alert, will make eye contact with you when you are speaking to her, but will not follow commands such as grabbing my hand, would not answer questions. Eyes: Normal exam ENT   Head: Normocephalic and atraumatic.  Mouth/Throat: Dry mucous membranes Cardiovascular: Normal rate, regular rhythm.  Respiratory: Normal respiratory effort without tachypnea nor retractions. Breath sounds are clear  Gastrointestinal: Soft and nontender. No distention.  No reaction to abdominal palpation. Musculoskeletal: No lower extremity edema, refusing to move extremities during examination. Neurologic: Patient refusing to speak or participate in neurological exam at this time. Skin:  Skin is warm, dry and intact.  Psychiatric: Mood and affect are  normal.   ____________________________________________   RADIOLOGY  Chest x-ray is negative for acute abnormality. CT negative for acute abnormality.  ____________________________________________   INITIAL IMPRESSION / ASSESSMENT AND PLAN / ED COURSE  Pertinent labs & imaging results that were available during my care of the patient were reviewed by me and considered in my medical decision making (see chart for details).  Patient presents to the emergency department for altered mental status over the past 4 days.  Differential would include infectious etiology, metabolic abnormality, renal dysfunction, electrolyte abnormality, CVA.  We will check labs including blood work and a urinalysis.  We will obtain a CT scan of the head, chest x-ray and continue to closely monitor.  Patient does have dry mucous membranes we will dose IV fluids while awaiting workup results.  Currently awaiting family arrival for further history.  Patient's workup has been largely nonrevealing.  CT scan and chest x-ray are normal.  Labs are largely within normal limits.  Troponin slightly elevated 0.04.  Family is now here and they give a history of the patient being very high functioning, ambulates with the help of a walker converses normally all this began changing approximately 5 days ago now for the past 2-3 days the patient has been largely nonverbal just with moaning at times.  Given this acute change in mental status we will admit to the hospital for further workup.  ____________________________________________   FINAL CLINICAL IMPRESSION(S) / ED DIAGNOSES  Altered mental status    Minna AntisPaduchowski, Lukasz Rogus, MD 12/31/16 1337

## 2016-12-31 NOTE — ED Notes (Signed)
ED Provider at bedside. 

## 2016-12-31 NOTE — H&P (Signed)
[facility]   PATIENT NAME: Cristina MayhewMary Chelf   [facility] MR#:  [id number]  DATE OF BIRTH:  01/18/1924  DATE OF ADMISSION:  12/31/2016  PRIMARY CARE PHYSICIAN: Dorothey BasemanBronstein, David, MD   REQUESTING/REFERRING PHYSICIAN: dr Lenard Lancepaduchowski  CHIEF COMPLAINT:   Weakness lethargy HISTORY OF PRESENT ILLNESS:  Cristina Coleman  is a 83 y.o. female with a known history of B12 deficiency, chronic bilateral subdural hematomas, hypertension and hypothyroidism who presents with concerns of left*G, confusion and poor by mouth intake. Patient was seen by her primary care physician on the 17th for similar complaints. Family reports over the past 5 days the symptoms have occurred. As mention it does appear that family actually saw primary care physician about 9 days ago with similar complaints. In any case over the past week to 2 weeks patient has had a decline intermittent mental status as well as poor by mouth intake. At her baseline she does ambulate with a walker or sometimes uses a wheelchair and is able to feed herself. She has 24-hour caregivers at home. In the emergency room CT the head was negative for acute pathology. Chest x-ray shows no pneumonia and urinalysis was negative.   PAST MEDICAL HISTORY:   Past Medical History:  Diagnosis Date  . Hyperlipidemia   . Hypertension   . Osteoporosis   . Thyroid disease     PAST SURGICAL HISTORY:   Past Surgical History:  Procedure Laterality Date  . KNEE SURGERY    . TUBAL LIGATION      SOCIAL HISTORY:   Social History   Tobacco Use  . Smoking status: Never Smoker  . Smokeless tobacco: Never Used  Substance Use Topics  . Alcohol use: No    FAMILY HISTORY:   Family History  Problem Relation Age of Onset  . Ovarian cancer Mother     DRUG ALLERGIES:  No Known Allergies  REVIEW OF SYSTEMS:   Review of Systems  Constitutional: Positive for malaise/fatigue and weight loss. Negative for chills and fever.   HENT: Negative.  Negative for ear discharge, ear pain, hearing loss, nosebleeds and sore throat.   Eyes: Negative.  Negative for blurred vision and pain.  Respiratory: Negative.  Negative for cough, hemoptysis, shortness of breath and wheezing.   Cardiovascular: Negative.  Negative for chest pain, palpitations and leg swelling.  Gastrointestinal: Negative.  Negative for abdominal pain, blood in stool, diarrhea, nausea and vomiting.  Genitourinary: Negative.  Negative for dysuria.  Musculoskeletal: Negative.  Negative for back pain.  Skin: Negative.   Neurological: Positive for weakness. Negative for dizziness, tremors, speech change, focal weakness, seizures and headaches.  Endo/Heme/Allergies: Negative.  Does not bruise/bleed easily.  Psychiatric/Behavioral: Negative.  Negative for depression, hallucinations and suicidal ideas.    MEDICATIONS AT HOME:   Prior to Admission medications   Medication Sig Start Date End Date Taking? Authorizing Provider  atorvastatin (LIPITOR) 20 MG tablet Take 1 tablet by mouth daily. 10/23/16  Yes [provider]  divalproex (DEPAKOTE SPRINKLE) 125 MG capsule Take 1 capsule by mouth 2 (two) times daily. 12/24/16  Yes [provider]  hydrochlorothiazide (MICROZIDE) 12.5 MG capsule Take 1 capsule by mouth daily. 12/24/16  Yes [provider]  levothyroxine (SYNTHROID, LEVOTHROID) 75 MCG tablet Take 1 tablet by mouth daily. 10/16/15  Yes [provider]  lisinopril (PRINIVIL,ZESTRIL) 40 MG tablet Take 1 tablet by mouth daily. 10/29/15  Yes [provider]  potassium chloride (MICRO-K) 10 MEQ CR capsule Take  1 capsule by mouth daily. 12/24/16  Yes [provider]  QUEtiapine (SEROQUEL) 25 MG tablet Take 1 tablet by mouth 2 (two) times daily. 12/24/16  Yes [provider]  ferrous sulfate 325 (65 FE) MG EC tablet Take 1 tablet (325 mg total) by mouth 2 (two) times daily with a meal. Patient not  taking: Reported on 12/31/2016 11/06/15   Milagros Loll, MD      VITAL SIGNS:  Blood pressure (!) 146/84, pulse 99, resp. rate 17, height 5\' 1"  (1.549 m), weight 61.2 kg (135 lb), SpO2 91 %.  PHYSICAL EXAMINATION:   Physical Exam  Constitutional: She is well-developed, well-nourished, and in no distress. No distress.  HENT:  Head: Normocephalic.  Eyes: No scleral icterus.  Neck: Normal range of motion. Neck supple. No JVD present. No tracheal deviation present.  Cardiovascular: Normal rate, regular rhythm and normal heart sounds. Exam reveals no gallop and no friction rub.  No murmur heard. Pulmonary/Chest: Effort normal and breath sounds normal. No respiratory distress. She has no wheezes. She has no rales. She exhibits no tenderness.  Abdominal: Soft. Bowel sounds are normal. She exhibits no distension and no mass. There is no tenderness. There is no rebound and no guarding.  Musculoskeletal: Normal range of motion. She exhibits no edema.  She demonstrates pain when lifting right leg at her hip  Neurological: She is alert.  Patient doesn't follow any commands. She is not verbal at this time.  Skin: Skin is warm. No rash noted. No erythema.  Psychiatric:  Opens eyes and is alert however does not converse      LABORATORY PANEL:   CBC Recent Labs  Lab 12/31/16 1225  WBC 12.8*  HGB 15.1  HCT 47.0  PLT 87*   ------------------------------------------------------------------------------------------------------------------  Chemistries  Recent Labs  Lab 12/31/16 1225  NA 146*  K 3.5  CL 111  CO2 27  GLUCOSE 117*  BUN 66*  CREATININE 1.22*  CALCIUM 11.3*  AST 17  ALT 12*  ALKPHOS 87  BILITOT 0.9   ------------------------------------------------------------------------------------------------------------------  Cardiac Enzymes Recent Labs  Lab 12/31/16 1225  TROPONINI 0.04*    ------------------------------------------------------------------------------------------------------------------  RADIOLOGY:  Ct Head Wo Contrast  Result Date: 12/31/2016 CLINICAL DATA:  Altered mental status with decreased awareness EXAM: CT HEAD WITHOUT CONTRAST TECHNIQUE: Contiguous axial images were obtained from the base of the skull through the vertex without intravenous contrast. COMPARISON:  November 26, 2015 FINDINGS: Brain: There is moderate diffuse atrophy. There has been interval resolution of previous extra-axial fluid collections. There is no appreciable intracranial mass, hemorrhage, extra-axial fluid collection, or midline shift. There is extensive small vessel disease throughout the centra semiovale bilaterally. There is small vessel disease throughout both basal ganglia regions with decreased attenuation in both internal and external capsules as well as in each lentiform nucleus. There is evidence of a prior small infarct in the posterior head of the caudate nucleus on the left. There is evidence of a prior infarct in the left thalamus. No acute infarct is demonstrated on this study. Vascular: There is calcification in each carotid siphon region as well as in the distal right middle cerebral artery, stable. Skull: The bony calvarium appears intact. There is a small right frontal-temporal junction enostosis without edema, a benign and stable finding. Sinuses/Orbits: There is mucosal thickening in several ethmoid air cells bilaterally. There is opacification of an ethmoid air cell on each side. Other visualized paranasal sinuses are clear. Orbits appear symmetric bilaterally. Other: Mastoid air cells are clear.  IMPRESSION: Atrophy with extensive supratentorial small vessel disease. No acute infarct evident. No mass or hemorrhage. Interval resolution of extra-axial fluid collections compared to prior study. Foci of arterial vascular calcification noted. Areas of ethmoid sinus disease  noted. Electronically Signed   By: Bretta BangWilliam  Woodruff III M.D.   On: 12/31/2016 12:36   Dg Chest Portable 1 View  Result Date: 12/31/2016 CLINICAL DATA:  Altered mental status. EXAM: PORTABLE CHEST 1 VIEW COMPARISON:  Radiographs of May 29, 2006. FINDINGS: Stable cardiomediastinal silhouette. Atherosclerosis of thoracic aorta is noted. No pneumothorax is noted. Right lung is clear. Mild left midlung subsegmental atelectasis or scarring is noted. Degenerative changes seen involving the left glenohumeral joint. IMPRESSION: Aortic atherosclerosis. Mild left midlung subsegmental atelectasis or scarring. Electronically Signed   By: Lupita RaiderJames  Green Jr, M.D.   On: 12/31/2016 12:20    EKG:   Pending  IMPRESSION AND PLAN:   83 year old female with a history of B12 deficiency, hypothyroidism and hypertension who presents to the emergency room with concern of left RG, poor by mouth intake and weakness.   1. Acute encephalopathy, metabolic: UA, chest x-ray and CT head were unrevealing. Order TSH, B12 and ammonia level Check Depakote level Order MRI brain Neuro checks every 4 hours Follow patient's mental status clinically Palliative care consultation requested Hold Depakote and Seroquel  2. Acute kidney injury in the setting of poor by mouth intake Start IV fluids BMP for a.m. Hold nephrotoxic medications 3. Mild hypernatremia in the setting of dehydration Continue IV fluids and recheck in a.m.   4. Hypothyroidism: Check TSH and continue Synthroid   5. Essential hypertension: Continue lisinopril and stop HCTZ      All the records are reviewed and case discussed with ED provider. Management plans discussed with the patient's niece who also works in Research scientist (physical sciences)behavioral health unit here in the hospital who is in agreement  CODE STATUS: Full  TOTAL TIME TAKING CARE OF THIS PATIENT: 45 minutes.    Francene Mcerlean M.D on 12/31/2016 at 1:54 PM  Between 7am to 6pm - Pager - [phone]  After 6pm go  to [email or web address] - Social research officer, governmentpassword [email or web address]  [facility]  Office  [phone]  CC: Primary care physician; Dorothey BasemanBronstein, David, MD

## 2017-01-01 ENCOUNTER — Inpatient Hospital Stay: Payer: Medicare Other

## 2017-01-01 ENCOUNTER — Inpatient Hospital Stay: Admit: 2017-01-01 | Payer: Medicare Other

## 2017-01-01 DIAGNOSIS — R4182 Altered mental status, unspecified: Secondary | ICD-10-CM

## 2017-01-01 DIAGNOSIS — R531 Weakness: Secondary | ICD-10-CM

## 2017-01-01 DIAGNOSIS — Z515 Encounter for palliative care: Secondary | ICD-10-CM

## 2017-01-01 DIAGNOSIS — I639 Cerebral infarction, unspecified: Principal | ICD-10-CM

## 2017-01-01 DIAGNOSIS — M436 Torticollis: Secondary | ICD-10-CM

## 2017-01-01 DIAGNOSIS — L8990 Pressure ulcer of unspecified site, unspecified stage: Secondary | ICD-10-CM

## 2017-01-01 DIAGNOSIS — Z7189 Other specified counseling: Secondary | ICD-10-CM

## 2017-01-01 LAB — BASIC METABOLIC PANEL
ANION GAP: 9 (ref 5–15)
BUN: 54 mg/dL — AB (ref 6–20)
CALCIUM: 10.8 mg/dL — AB (ref 8.9–10.3)
CHLORIDE: 117 mmol/L — AB (ref 101–111)
CO2: 23 mmol/L (ref 22–32)
Creatinine, Ser: 0.88 mg/dL (ref 0.44–1.00)
GFR calc Af Amer: >60 mL/min (ref >60)
GFR calc non Af Amer: 56 mL/min — ABNORMAL LOW (ref >60)
GLUCOSE: 81 mg/dL (ref 65–99)
POTASSIUM: 3.9 mmol/L (ref 3.5–5.1)
Sodium: 149 mmol/L — ABNORMAL HIGH (ref 135–145)

## 2017-01-01 LAB — LIPID PANEL
CHOLESTEROL: 122 mg/dL (ref 0–200)
HDL: 40 mg/dL — ABNORMAL LOW (ref >40)
LDL CALC: 64 mg/dL (ref 0–99)
Total CHOL/HDL Ratio: 3.1 RATIO
Triglycerides: 92 mg/dL (ref <150)
VLDL: 18 mg/dL (ref 0–40)

## 2017-01-01 LAB — LACTIC ACID, PLASMA: LACTIC ACID, VENOUS: 1.4 mmol/L (ref 0.5–1.9)

## 2017-01-01 LAB — URINE CULTURE: Culture: NO GROWTH

## 2017-01-01 LAB — HEMOGLOBIN A1C
Hgb A1c MFr Bld: 5.9 % — ABNORMAL HIGH (ref 4.8–5.6)
Mean Plasma Glucose: 122.63 mg/dL

## 2017-01-01 LAB — CBC
HCT: 45.3 % (ref 35.0–47.0)
Hemoglobin: 14.3 g/dL (ref 12.0–16.0)
MCH: 30.2 pg (ref 26.0–34.0)
MCHC: 31.6 g/dL — ABNORMAL LOW (ref 32.0–36.0)
MCV: 95.6 fL (ref 80.0–100.0)
PLATELETS: 82 10*3/uL — AB (ref 150–440)
RBC: 4.74 MIL/uL (ref 3.80–5.20)
RDW: 15.8 % — ABNORMAL HIGH (ref 11.5–14.5)
WBC: 9.3 10*3/uL (ref 3.6–11.0)

## 2017-01-01 MED ORDER — SODIUM CHLORIDE 0.9% FLUSH: 3.0000 mL | INTRAVENOUS | Status: DC | PRN

## 2017-01-01 MED ORDER — GLYCOPYRROLATE 0.2 MG/ML IJ SOLN
0.2000 mg | INTRAMUSCULAR | Status: DC | PRN
  Filled 2017-01-01: qty 1

## 2017-01-01 MED ORDER — HALOPERIDOL 0.5 MG PO TABS
0.5000 mg | ORAL_TABLET | ORAL | Status: DC | PRN
  Filled 2017-01-01: qty 1

## 2017-01-01 MED ORDER — HALOPERIDOL LACTATE 5 MG/ML IJ SOLN: 0.5000 mg | INTRAMUSCULAR | Status: DC | PRN

## 2017-01-01 MED ORDER — SODIUM CHLORIDE 0.45 % IV SOLN
INTRAVENOUS | Status: DC
  Administered 2017-01-01: 11:00:00 via INTRAVENOUS

## 2017-01-01 MED ORDER — GLYCOPYRROLATE 1 MG PO TABS
1.0000 mg | ORAL_TABLET | ORAL | Status: DC | PRN
  Filled 2017-01-01: qty 1

## 2017-01-01 MED ORDER — ONDANSETRON 4 MG PO TBDP: 4.0000 mg | ORAL_TABLET | Freq: Four times a day (QID) | ORAL | Status: DC | PRN

## 2017-01-01 MED ORDER — SODIUM CHLORIDE 0.9 % IV SOLN: 250.0000 mL | INTRAVENOUS | Status: DC | PRN

## 2017-01-01 MED ORDER — ACETAMINOPHEN 650 MG RE SUPP: 650.0000 mg | Freq: Four times a day (QID) | RECTAL | Status: DC | PRN

## 2017-01-01 MED ORDER — SODIUM CHLORIDE 0.9% FLUSH
3.0000 mL | Freq: Two times a day (BID) | INTRAVENOUS | Status: DC
  Administered 2017-01-01 – 2017-01-02 (×2): 3 mL via INTRAVENOUS

## 2017-01-01 MED ORDER — ONDANSETRON HCL 4 MG/2ML IJ SOLN: 4.0000 mg | Freq: Four times a day (QID) | INTRAMUSCULAR | Status: DC | PRN

## 2017-01-01 MED ORDER — LORAZEPAM 2 MG/ML IJ SOLN: 1.0000 mg | INTRAMUSCULAR | Status: DC | PRN

## 2017-01-01 MED ORDER — LORAZEPAM 2 MG/ML PO CONC: 1.0000 mg | ORAL | Status: DC | PRN

## 2017-01-01 MED ORDER — ASPIRIN 300 MG RE SUPP
300.0000 mg | Freq: Every day | RECTAL | Status: DC
  Administered 2017-01-01: 300 mg via RECTAL
  Filled 2017-01-01: qty 1

## 2017-01-01 MED ORDER — HALOPERIDOL LACTATE 2 MG/ML PO CONC
0.5000 mg | ORAL | Status: DC | PRN
Start: 2017-01-01 — End: 2017-01-02
  Filled 2017-01-01: qty 0.3

## 2017-01-01 MED ORDER — MORPHINE SULFATE (CONCENTRATE) 10 MG/0.5ML PO SOLN: 5.0000 mg | ORAL | Status: DC | PRN

## 2017-01-01 MED ORDER — POLYVINYL ALCOHOL 1.4 % OP SOLN
1.0000 [drp] | Freq: Four times a day (QID) | OPHTHALMIC | Status: DC | PRN
  Filled 2017-01-01: qty 15

## 2017-01-01 MED ORDER — LORAZEPAM 1 MG PO TABS: 1.0000 mg | ORAL_TABLET | ORAL | Status: DC | PRN

## 2017-01-01 MED ORDER — ACETAMINOPHEN 325 MG PO TABS: 650.0000 mg | ORAL_TABLET | Freq: Four times a day (QID) | ORAL | Status: DC | PRN

## 2017-01-01 MED ORDER — BIOTENE DRY MOUTH MT LIQD: 15.0000 mL | OROMUCOSAL | Status: DC | PRN

## 2017-01-01 NOTE — Clinical Social Work Note (Signed)
Clinical Social Work Assessment  Patient Details  Name: Cristina Coleman MRN: [id number] Date of Birth: 10-26-1924  Date of referral:  01/01/17               Reason for consult:  End of Life/Hospice                Permission sought to share information with:  Oceanographeracility Contact Representative Permission granted to share information::  Yes, Verbal Permission Granted  Name::        Agency::     Relationship::     Contact Information:     Housing/Transportation Living arrangements for the past 2 months:  Single Family Home Source of Information:  Other (Comment Required)(Niece Cristina Coleman. ) Patient Interpreter Needed:  None Criminal Activity/Legal Involvement Pertinent to Current Situation/Hospitalization:  No - Comment as needed Significant Relationships:  Other Family Members, Adult Children Lives with:  Adult Children Do you feel safe going back to the place where you live?    Need for family participation in patient care:  Yes (Comment)  Care giving concerns:  [place] in [place] 2 sons.    Social Worker assessment / plan:  Visual merchandiserClinical Social Worker (CSW) received a residential hospice placement consult. CSW contacted patient's niece Cristina Coleman. Per Cristina Coleman patient lives [place] 2 [place]. Per Cristina Coleman either herself or patient's sister Cristina Coleman will sign consent forms. Per Cristina Coleman the family has decided on comfort care and would like for patient to go to a hospice facility. CSW provided hospice facility choice and Cristina Coleman chose [address]. [facility] lia[facility] is aware of above and will follow up with the family. CSW will continue to follow and assist as needed.   Employment status:  Disabled (Comment on whether or not currently receiving Disability), Retired Database administratornsurance information:  Managed Medicare PT Recommendations:  Not assessed at this time Information / Referral to community resources:  Other (Comment Required)( Hospice Facility )  Patient/Family's  Response to care:  Patient's family has chosen [facility].   Patient/Family's Understanding of and Emotional Response to Diagnosis, Current Treatment, and Prognosis:  Patient's niece Cristina Coleman was very pleasant and thanked CSW for assistance. Cristina Coleman understands that patient has a poor prognosis.   Emotional Assessment Appearance:  Appears stated age Attitude/Demeanor/Rapport:  Unable to Assess Affect (typically observed):  Unable to Assess Orientation:  Oriented to Self, Fluctuating Orientation (Suspected and/or reported Sundowners) Alcohol / Substance use:  Not Applicable Psych involvement (Current and /or in the community):  No (Comment)  Discharge Needs  Concerns to be addressed:  Discharge Planning Concerns Readmission within the last 30 days:  No Current discharge risk:  Dependent with Mobility, Cognitively Impaired, Chronically ill, Terminally ill Barriers to Discharge:  Continued Medical Work up   [employer], Darleen CrockerBailey M, LCSW 01/01/2017, 4:56 PM

## 2017-01-01 NOTE — Progress Notes (Signed)
PT Cancellation Note  Patient Details Name: Cristina Coleman MRN: [id number] DOB: Jul 01, 1924   Cancelled Treatment:    Reason Eval/Treat Not Completed: Other (comment).  PT order cancelled by Palliative Care.  Hendricks LimesEmily Danaya Geddis, PT 01/01/17, 2:41 PM [phone]

## 2017-01-01 NOTE — Consult Note (Signed)
Referring Physician: Sudini    Chief Complaint: Weakness, lethargy  HPI: Cristina Coleman is an 83 y.o. female who is unable to provide any history today.  Family unavailable therefore all history obtained from the chart.  Patient brought to the ED with concerns of lethargy, confusion and poor oral intake. Patient was seen by her primary care physician on the 17th for similar complaints that had been present for about a week.  On that visit there is a comment that the patient has been in a wheelchair for the past few visits.  Exam revealed right sided weakness and confused speech.  Patient seen by OB on 12/20.  No exam documentation at that time but comments to the fact that patient unable to make any decisions.  At her baseline she does ambulate with a walker some but also uses a wheelchair and is able to feed herself. She has 24-hour caregivers at home.  Date last known well: Unable to determine Time last known well: Unable to determine tPA Given: No: Unable to determine LKW  Past Medical History:  Diagnosis Date  . Hyperlipidemia   . Hypertension   . Osteoporosis   . Thyroid disease     Past Surgical History:  Procedure Laterality Date  . KNEE SURGERY    . TUBAL LIGATION      Family History  Problem Relation Age of Onset  . Ovarian cancer Mother    Social History:  reports that  has never smoked. she has never used smokeless tobacco. She reports that she does not drink alcohol or use drugs.  Allergies: No Known Allergies  Medications:  I have reviewed the patient's current medications. Prior to Admission:  Medications Prior to Admission  Medication Sig Dispense Refill Last Dose  . atorvastatin (LIPITOR) 20 MG tablet Take 1 tablet by mouth daily.   Past Week at Unknown time  . divalproex (DEPAKOTE SPRINKLE) 125 MG capsule Take 1 capsule by mouth 2 (two) times daily.   Past Week at Unknown time  . hydrochlorothiazide (MICROZIDE) 12.5 MG capsule Take 1 capsule by mouth daily.    Past Week at Unknown time  . levothyroxine (SYNTHROID, LEVOTHROID) 75 MCG tablet Take 1 tablet by mouth daily.   Past Week at Unknown time  . lisinopril (PRINIVIL,ZESTRIL) 40 MG tablet Take 1 tablet by mouth daily.   Past Week at Unknown time  . potassium chloride (MICRO-K) 10 MEQ CR capsule Take 1 capsule by mouth daily.   Past Week at Unknown time  . QUEtiapine (SEROQUEL) 25 MG tablet Take 1 tablet by mouth 2 (two) times daily.   Past Week at Unknown time  . ferrous sulfate 325 (65 FE) MG EC tablet Take 1 tablet (325 mg total) by mouth 2 (two) times daily with a meal. (Patient not taking: Reported on 12/31/2016) 60 tablet 0 Not Taking at Unknown time   Scheduled: . levothyroxine  75 mcg Oral QAC breakfast  . lisinopril  40 mg Oral Daily    ROS: Unable to provide due to mental status  Physical Examination: Blood pressure (!) 155/82, pulse (!) 104, temperature 98.7 F (37.1 C), resp. rate 18, height 5\' 1"  (1.549 m), weight 60.9 kg (134 lb 3.2 oz), SpO2 99 %.  HEENT-  Normocephalic, no lesions, without obvious abnormality.  Normal external eye and conjunctiva.  Normal TM's bilaterally.  Normal auditory canals and external ears. Normal external nose, mucus membranes and septum.  Normal pharynx. Cardiovascular- S1, S2 normal, pulses palpable throughout  Lungs- chest clear, no wheezing, rales, normal symmetric air entry Abdomen- soft, non-tender; bowel sounds normal; no masses,  no organomegaly Extremities- no edema Lymph-no adenopathy palpable Musculoskeletal-cervical torticollis with head turned to the left Skin-warm and dry, no hyperpigmentation, vitiligo, or suspicious lesions  Neurological Examination   Mental Status: Alert.  Gives one to two word answers to questions that appear appropriate.  Able to follow simple commands without difficulty. Cranial Nerves: II: Discs flat bilaterally; Visual fields grossly normal, pupils equal, round, reactive to light and accommodation III,IV,  VI: ptosis not present, extra-ocular motions intact bilaterally V,VII: smile symmetric, facial light touch sensation normal bilaterally VIII: hearing normal bilaterally IX,X: gag reflex present XI: unable to test XII: midline tongue extension Motor: Patient moves the left upper extremity strongly.  3-4/5 strength with the RUE.  4+/5 LLE, 3+/5 RLE Sensory: Pinprick and light touch intact throughout, bilaterally Deep Tendon Reflexes: 1+ throughout with absent AJ's bilaterally Plantars: Right: mute   Left: upgoing Cerebellar: Unable to perform due to weakness Gait: deferred secondary to safety concerns  Laboratory Studies:  Basic Metabolic Panel: Recent Labs  Lab 12/31/16 1225 01/01/17 0422  NA 146* 149*  K 3.5 3.9  CL 111 117*  CO2 27 23  GLUCOSE 117* 81  BUN 66* 54*  CREATININE 1.22* 0.88  CALCIUM 11.3* 10.8*    Liver Function Tests: Recent Labs  Lab 12/31/16 1225  AST 17  ALT 12*  ALKPHOS 87  BILITOT 0.9  PROT 7.9  ALBUMIN 3.1*   No results for input(s): LIPASE, AMYLASE in the last 168 hours. Recent Labs  Lab 12/31/16 1800  AMMONIA 18    CBC: Recent Labs  Lab 12/31/16 1225 01/01/17 0422  WBC 12.8* 9.3  HGB 15.1 14.3  HCT 47.0 45.3  MCV 94.8 95.6  PLT 87* 82*    Cardiac Enzymes: Recent Labs  Lab 12/31/16 1225  TROPONINI 0.04*    BNP: Invalid input(s): POCBNP  CBG: No results for input(s): GLUCAP in the last 168 hours.  Microbiology: Results for orders placed or performed during the hospital encounter of 12/31/16  Urine culture     Status: None   Collection Time: 12/31/16 11:40 AM  Result Value Ref Range Status   Specimen Description   Final    URINE, RANDOM Performed at [facility], [address] [place], [place] [address]    Special Requests   Final    NONE Performed at [facility], [address] [place], [place] [address]    Culture   Final    NO GROWTH Performed at [facility], [address] [place], [place] [address]    Report Status 01/01/2017 FINAL  Final    Coagulation Studies: No results for input(s): LABPROT, INR in the last 72 hours.  Urinalysis:  Recent Labs  Lab 12/31/16 1140  COLORURINE AMBER*  LABSPEC 1.026  PHURINE 5.0  GLUCOSEU NEGATIVE  HGBUR NEGATIVE  BILIRUBINUR NEGATIVE  KETONESUR NEGATIVE  PROTEINUR 30*  NITRITE NEGATIVE  LEUKOCYTESUR NEGATIVE    Lipid Panel:    Component Value Date/Time   CHOL 122 01/01/2017 0422   TRIG 92 01/01/2017 0422   HDL 40 (L) 01/01/2017 0422   CHOLHDL 3.1 01/01/2017 0422   VLDL 18 01/01/2017 0422   LDLCALC 64 01/01/2017 0422    HgbA1C:  Lab Results  Component Value Date   HGBA1C 5.9 (H) 01/01/2017    Urine Drug Screen:  No results found for: LABOPIA, COCAINSCRNUR, LABBENZ, AMPHETMU, THCU,  LABBARB  Alcohol Level: No results for input(s): ETH in the last 168 hours.   Imaging: Ct Head Wo Contrast  Result Date: 12/31/2016 CLINICAL DATA:  Altered mental status with decreased awareness EXAM: CT HEAD WITHOUT CONTRAST TECHNIQUE: Contiguous axial images were obtained from the base of the skull through the vertex without intravenous contrast. COMPARISON:  November 26, 2015 FINDINGS: Brain: There is moderate diffuse atrophy. There has been interval resolution of previous extra-axial fluid collections. There is no appreciable intracranial mass, hemorrhage, extra-axial fluid collection, or midline shift. There is extensive small vessel disease throughout the centra semiovale bilaterally. There is small vessel disease throughout both basal ganglia regions with decreased attenuation in both internal and external capsules as well as in each lentiform nucleus. There is evidence of a prior small infarct in the posterior head of the caudate nucleus on the left. There is evidence of a prior infarct in the left thalamus. No acute infarct is demonstrated on this study. Vascular: There is calcification in each carotid siphon  region as well as in the distal right middle cerebral artery, stable. Skull: The bony calvarium appears intact. There is a small right frontal-temporal junction enostosis without edema, a benign and stable finding. Sinuses/Orbits: There is mucosal thickening in several ethmoid air cells bilaterally. There is opacification of an ethmoid air cell on each side. Other visualized paranasal sinuses are clear. Orbits appear symmetric bilaterally. Other: Mastoid air cells are clear. IMPRESSION: Atrophy with extensive supratentorial small vessel disease. No acute infarct evident. No mass or hemorrhage. Interval resolution of extra-axial fluid collections compared to prior study. Foci of arterial vascular calcification noted. Areas of ethmoid sinus disease noted. Electronically Signed   By: Bretta BangWilliam  Woodruff III M.D.   On: 12/31/2016 12:36   Mr Brain Wo Contrast  Result Date: 12/31/2016 CLINICAL DATA:  83 y/o F; 4 days of progressive altered mental status. EXAM: MRI HEAD WITHOUT CONTRAST TECHNIQUE: Multiplanar, multiecho pulse sequences of the brain and surrounding structures were obtained without intravenous contrast. COMPARISON:  12/31/2016 CT of the head. FINDINGS: Brain: 5 mm foci of reduced diffusion are present in the left frontal subcortical white matter in the left inferior cerebellar hemisphere (series 100, image 14 and 41) compatible with acute/ early subacute infarction. No associated hemorrhage or mass effect. Confluent nonspecific foci of T2 FLAIR hyperintense signal abnormality in subcortical and periventricular white matter are compatible with severe chronic microvascular ischemic changes for age. Severe brain parenchymal volume loss. There are a few punctate foci of susceptibility hypointensity in the right cerebellar hemisphere in bilateral cerebral hemispheres compatible with chronic hemosiderin deposition of microhemorrhage. There are several very small chronic infarctions in the cerebellar hemispheres  bilaterally and chronic lacunar infarctions within bilateral thalamus and caudate nuclei. No extra-axial collection or hydrocephalus. Vascular: Normal flow voids. Skull and upper cervical spine: Normal marrow signal. Sinuses/Orbits: Mild diffuse paranasal sinus mucosal thickening. Bilateral intra-ocular lens replacement. No abnormal signal of mastoid air cells. Other: None. IMPRESSION: 1. 5 mm acute/early subacute small vessel infarctions are present within left cerebellar hemisphere and left frontal subcortical white matter. No associated hemorrhage or mass effect. 2. Severe chronic microvascular ischemic changes and severe parenchymal volume loss of the brain. 3. Mild diffuse paranasal sinus disease. These results will be called to the ordering clinician or representative by the Radiologist Assistant, and communication documented in the PACS or zVision Dashboard. Electronically Signed   By: Mitzi HansenLance  Furusawa-Stratton M.D.   On: 12/31/2016 22:14   Dg Chest Portable 1 View  Result Date: 12/31/2016 CLINICAL DATA:  Altered mental status. EXAM: PORTABLE CHEST 1 VIEW COMPARISON:  Radiographs of May 29, 2006. FINDINGS: Stable cardiomediastinal silhouette. Atherosclerosis of thoracic aorta is noted. No pneumothorax is noted. Right lung is clear. Mild left midlung subsegmental atelectasis or scarring is noted. Degenerative changes seen involving the left glenohumeral joint. IMPRESSION: Aortic atherosclerosis. Mild left midlung subsegmental atelectasis or scarring. Electronically Signed   By: Lupita Raider, M.D.   On: 12/31/2016 12:20   Dg Hip Unilat W Or Wo Pelvis 2-3 Views Left  Result Date: 12/31/2016 CLINICAL DATA:  Altered mental status. Patient is complaining of left hip pain. EXAM: DG HIP (WITH OR WITHOUT PELVIS) 2-3V LEFT COMPARISON:  Scout radiograph from an abdominal and pelvic CT scan of May 27, 2006. FINDINGS: The bony pelvis is osteopenic. There are no acute pelvic fractures. There are degenerative  changes of the lower lumbar spine. There is narrowing of the hip joints bilaterally. AP and lateral views of the left hip reveal no acute fracture or dislocation. The soft tissues are unremarkable. IMPRESSION: Moderate degenerative narrowing of the left hip joint. No acute left hip or pelvic fracture. Degenerative changes of the lower lumbar spine. Abdominal aortic atherosclerosis. Electronically Signed   By: David  [place] M.D.   On: 12/31/2016 14:14    Assessment: 83 y.o. female with a history of HTN and HLD who presents with right sided weakness and FTT.  On examination with torticollis, difficulty with speech and right sided weakness.  MRI of the brain reviewed and shows small acute left cerebellar hemisphere and left frontal infarcts. Suspect subacute small right frontal infarct as well.  Etiology likely embolic.   Patient on no antiplatelet therapy at home.  Suspect presentation is multifactorial and related to infarcts and dehydration, torticollis.   TSH, B12 normal.  VPA level<10.  LDL 64.  A1c 5.9.    Stroke Risk Factors - hyperlipidemia and hypertension  Plan: 1. PT consult, OT consult, Speech consult 2. Echocardiogram pending 3. Carotid dopplers pending 4. Prophylactic therapy-Antiplatelet med: Aspirin - dose 325mg  daily or 300mg  rectally if unable to take po. 5. NPO until RN stroke swallow screen 6. Telemetry monitoring 7. Frequent neuro checks 8. Once cleared to take po would start a trial of Sinemet 25/100 TID for treatment of torticollis. Further dose adjustment may be required.      Thana Farr, MD Neurology [phone] 01/01/2017, 12:52 PM

## 2017-01-01 NOTE — Progress Notes (Signed)
OT Cancellation Note  Patient Details Name: Cristina Coleman MRN: [id number] DOB: 1924/12/28   Cancelled Treatment:    Reason Eval/Treat Not Completed: Other (comment). Order received, chart reviewed. Functional decline over the past 1-2 weeks. MRI + for acute infarct. Per palliative care, meeting with pt's family scheduled for 1pm today for GOC conversation. Will hold OT evaluation this morning and re-attempt in afternoon as appropriate pending GOC after palliative care meets with family.   [name], MPH, MS, OTR/L ascom [phone] 01/01/17, 10:30 AM

## 2017-01-01 NOTE — Consult Note (Signed)
Consultation Note Date: 01/01/2017   Patient Name: Cristina Coleman  DOB: 1924/01/24  MRN: [id number]  Age / Sex: 83 y.o., female  PCP: Dorothey BasemanBronstein, David, MD Referring Physician: Milagros LollSudini, Srikar, MD  Reason for Consultation: Establishing goals of care  HPI/Patient Profile: Cristina Coleman is an 83 y.o. female who is unable to provide any history today.  Family unavailable therefore all history obtained from the chart.  Patient brought to the ED withconcerns of lethargy, confusion and poor oral intake.   Clinical Assessment and Goals of Care: Cristina Coleman is resting in bed with a gaze to the left. Many family members at bedside including 3 sisters and several nieces. Family states she has not been eating and drinking well, and that this has gotten progressively worse. She is not speaking, eating or drinking.   We discussed diagnosis, prognosis, GOC, EOL wishes disposition and options.  A detailed discussion was had today regarding advanced directives.  Concepts specific to code status, artifical feeding and hydration, continued IV antibiotics and rehospitalization was had.  The difference between a aggressive medical intervention path and a hospice comfort care path for this patient at this time was discussed.    Values and goals of care important to patient and family were attempted to be elicited. Family unanimously decided for comfort care measures only and would like inpatient hospice. Marland Kitchen.   MOST form completed for comfort measures.    Natural trajectory and expectations at EOL were discussed.  Questions and concerns addressed.   Family encouraged to call with questions or concerns.  PMT will continue to support holistically.  Sisters are next of kin decision makers.    SUMMARY OF RECOMMENDATIONS   Comfort care only, inpatient hospice recommended.   Code Status/Advance Care  Planning:  DNR    Symptom Management:   EOL order set in place.  Palliative Prophylaxis:   Oral Care  Additional Recommendations (Limitations, Scope, Preferences):  Full Comfort Care   Prognosis:   < 2 weeks recent intracranial infarcts. Patient has not been eating or drinking well for the past few weeks at home, and has not here.   Discharge Planning: Hospice facility      Primary Diagnoses: Present on Admission: **None**   I have reviewed the medical record, interviewed the patient and family, and examined the patient. The following aspects are pertinent.  Past Medical History:  Diagnosis Date  . Hyperlipidemia   . Hypertension   . Osteoporosis   . Thyroid disease    Social History   Socioeconomic History  . Marital status: Widowed    Spouse name: None  . Number of children: None  . Years of education: None  . Highest education level: None  Social Needs  . Financial resource strain: None  . Food insecurity - worry: None  . Food insecurity - inability: None  . Transportation needs - medical: None  . Transportation needs - non-medical: None  Occupational History  . None  Tobacco Use  . Smoking status: Never Smoker  .  Smokeless tobacco: Never Used  Substance and Sexual Activity  . Alcohol use: No  . Drug use: No  . Sexual activity: None  Other Topics Concern  . None  Social History Narrative  . None   Family History  Problem Relation Age of Onset  . Ovarian cancer Mother    Scheduled Meds: . aspirin  300 mg Rectal Daily  . levothyroxine  75 mcg Oral QAC breakfast  . lisinopril  40 mg Oral Daily   Continuous Infusions: . sodium chloride 30 mL/hr at 01/01/17 1102   PRN Meds:.acetaminophen **OR** acetaminophen, ondansetron **OR** ondansetron (ZOFRAN) IV, senna-docusate, traMADol Medications Prior to Admission:  Prior to Admission medications   Medication Sig Start Date End Date Taking? Authorizing Provider  atorvastatin (LIPITOR) 20 MG  tablet Take 1 tablet by mouth daily. 10/23/16  Yes [provider]  divalproex (DEPAKOTE SPRINKLE) 125 MG capsule Take 1 capsule by mouth 2 (two) times daily. 12/24/16  Yes [provider]  hydrochlorothiazide (MICROZIDE) 12.5 MG capsule Take 1 capsule by mouth daily. 12/24/16  Yes [provider]  levothyroxine (SYNTHROID, LEVOTHROID) 75 MCG tablet Take 1 tablet by mouth daily. 10/16/15  Yes [provider]  lisinopril (PRINIVIL,ZESTRIL) 40 MG tablet Take 1 tablet by mouth daily. 10/29/15  Yes [provider]  potassium chloride (MICRO-K) 10 MEQ CR capsule Take 1 capsule by mouth daily. 12/24/16  Yes [provider]  QUEtiapine (SEROQUEL) 25 MG tablet Take 1 tablet by mouth 2 (two) times daily. 12/24/16  Yes [provider]  ferrous sulfate 325 (65 FE) MG EC tablet Take 1 tablet (325 mg total) by mouth 2 (two) times daily with a meal. Patient not taking: Reported on 12/31/2016 11/06/15   Milagros LollSudini, Srikar, MD   No Known Allergies Review of Systems  Unable to perform ROS   Physical Exam  Constitutional:  Frail. Left gaze.   Pulmonary/Chest: Effort normal.  Neurological: Cranial nerve deficit:   Eyes open.    Vital Signs: BP (!) 150/76 (BP Location: Right Arm)   Pulse 100   Temp 98.6 F (37 C) (Oral)   Resp 18   Ht 5\' 1"  (1.549 m)   Wt 60.9 kg (134 lb 3.2 oz)   SpO2 98%   BMI 25.36 kg/m  Pain Assessment: No/denies pain       SpO2: SpO2: 98 % O2 Device:SpO2: 98 % O2 Flow Rate: .O2 Flow Rate (L/min): 2 L/min  IO: Intake/output summary:   Intake/Output Summary (Last 24 hours) at 01/01/2017 1356 Last data filed at 01/01/2017 0121 Gross per 24 hour  Intake 1029.75 ml  Output -  Net 1029.75 ml    LBM: Last BM Date: 12/30/16 Baseline Weight: Weight: 61.2 kg (135 lb) Most recent weight: Weight: 60.9 kg (134 lb 3.2 oz)     Palliative Assessment/Data: 10%     Time In: 1:00 Time Out: 2:10 Time Total: 70  min Greater than 50%  of this time was spent counseling and coordinating care related to the above assessment and plan.  Signed by: Morton Stallrystal Mariyana Fulop, NP   Please contact Palliative Medicine Team phone at [phone] for questions and concerns.  For individual provider: See Loretha StaplerAmion

## 2017-01-01 NOTE — Progress Notes (Signed)
[facility] following a Palliative Medicine consult. Writer contacted patient's niece Cristina FerraraJanice Coleman via telephone [phone]), she is in agreement with transfer tomorrow 12/28. Writer to follow up in the morning. Dayna BarkerKaren Robertson RN, BSN, [facility] [place], [facility] [phone]

## 2017-01-01 NOTE — Progress Notes (Signed)
[employer] - [place] at [facility]   PATIENT NAME: Cristina Coleman    MR#:  [id number]  DATE OF BIRTH:  08/22/1924  SUBJECTIVE:  CHIEF COMPLAINT:   Chief Complaint  Patient presents with  . Diarrhea  . Failure To Thrive   Patient answers 1-2 words.  Has her head turned to the left. Not following instructions.  REVIEW OF SYSTEMS:    Review of Systems  Unable to perform ROS: Mental status change    DRUG ALLERGIES:  No Known Allergies  VITALS:  Blood pressure (!) 150/76, pulse 100, temperature 98.6 F (37 C), temperature source Oral, resp. rate 18, height 5\' 1"  (1.549 m), weight 60.9 kg (134 lb 3.2 oz), SpO2 98 %.  PHYSICAL EXAMINATION:   Physical Exam  GENERAL:  83 y.o.-year-old patient lying in the bed with no acute distress.  EYES: Pupils equal, round, reactive to light and accommodation. No scleral icterus. Extraocular muscles intact.  HEENT: Head atraumatic, normocephalic. Oropharynx and nasopharynx clear.  NECK:  Supple, no jugular venous distention. No thyroid enlargement, no tenderness.  LUNGS: Normal breath sounds bilaterally, no wheezing, rales, rhonchi. No use of accessory muscles of respiration.  CARDIOVASCULAR: S1, S2 normal. No murmurs, rubs, or gallops.  ABDOMEN: Soft, nontender, nondistended. Bowel sounds present. No organomegaly or mass.  EXTREMITIES: No cyanosis, clubbing or edema b/l.    NEUROLOGIC: Motor strength R<L PSYCHIATRIC: The patient is confused  LABORATORY PANEL:   CBC Recent Labs  Lab 01/01/17 0422  WBC 9.3  HGB 14.3  HCT 45.3  PLT 82*   ------------------------------------------------------------------------------------------------------------------ Chemistries  Recent Labs  Lab 12/31/16 1225 01/01/17 0422  NA 146* 149*  K 3.5 3.9  CL 111 117*  CO2 27 23  GLUCOSE 117* 81  BUN 66* 54*  CREATININE 1.22* 0.88  CALCIUM 11.3* 10.8*  AST 17  --   ALT 12*  --   ALKPHOS 87  --   BILITOT 0.9  --     ------------------------------------------------------------------------------------------------------------------  Cardiac Enzymes Recent Labs  Lab 12/31/16 1225  TROPONINI 0.04*   ------------------------------------------------------------------------------------------------------------------  RADIOLOGY:  Ct Head Wo Contrast  Result Date: 12/31/2016 CLINICAL DATA:  Altered mental status with decreased awareness EXAM: CT HEAD WITHOUT CONTRAST TECHNIQUE: Contiguous axial images were obtained from the base of the skull through the vertex without intravenous contrast. COMPARISON:  November 26, 2015 FINDINGS: Brain: There is moderate diffuse atrophy. There has been interval resolution of previous extra-axial fluid collections. There is no appreciable intracranial mass, hemorrhage, extra-axial fluid collection, or midline shift. There is extensive small vessel disease throughout the centra semiovale bilaterally. There is small vessel disease throughout both basal ganglia regions with decreased attenuation in both internal and external capsules as well as in each lentiform nucleus. There is evidence of a prior small infarct in the posterior head of the caudate nucleus on the left. There is evidence of a prior infarct in the left thalamus. No acute infarct is demonstrated on this study. Vascular: There is calcification in each carotid siphon region as well as in the distal right middle cerebral artery, stable. Skull: The bony calvarium appears intact. There is a small right frontal-temporal junction enostosis without edema, a benign and stable finding. Sinuses/Orbits: There is mucosal thickening in several ethmoid air cells bilaterally. There is opacification of an ethmoid air cell on each side. Other visualized paranasal sinuses are clear. Orbits appear symmetric bilaterally. Other: Mastoid air cells are clear. IMPRESSION: Atrophy with extensive supratentorial small vessel disease. No acute infarct  evident. No mass or hemorrhage. Interval resolution of extra-axial fluid collections compared to prior study. Foci of arterial vascular calcification noted. Areas of ethmoid sinus disease noted. Electronically Signed   By: Bretta Bang III M.D.   On: 12/31/2016 12:36   Mr Brain Wo Contrast  Result Date: 12/31/2016 CLINICAL DATA:  83 y/o F; 4 days of progressive altered mental status. EXAM: MRI HEAD WITHOUT CONTRAST TECHNIQUE: Multiplanar, multiecho pulse sequences of the brain and surrounding structures were obtained without intravenous contrast. COMPARISON:  12/31/2016 CT of the head. FINDINGS: Brain: 5 mm foci of reduced diffusion are present in the left frontal subcortical white matter in the left inferior cerebellar hemisphere (series 100, image 14 and 41) compatible with acute/ early subacute infarction. No associated hemorrhage or mass effect. Confluent nonspecific foci of T2 FLAIR hyperintense signal abnormality in subcortical and periventricular white matter are compatible with severe chronic microvascular ischemic changes for age. Severe brain parenchymal volume loss. There are a few punctate foci of susceptibility hypointensity in the right cerebellar hemisphere in bilateral cerebral hemispheres compatible with chronic hemosiderin deposition of microhemorrhage. There are several very small chronic infarctions in the cerebellar hemispheres bilaterally and chronic lacunar infarctions within bilateral thalamus and caudate nuclei. No extra-axial collection or hydrocephalus. Vascular: Normal flow voids. Skull and upper cervical spine: Normal marrow signal. Sinuses/Orbits: Mild diffuse paranasal sinus mucosal thickening. Bilateral intra-ocular lens replacement. No abnormal signal of mastoid air cells. Other: None. IMPRESSION: 1. 5 mm acute/early subacute small vessel infarctions are present within left cerebellar hemisphere and left frontal subcortical white matter. No associated hemorrhage or mass  effect. 2. Severe chronic microvascular ischemic changes and severe parenchymal volume loss of the brain. 3. Mild diffuse paranasal sinus disease. These results will be called to the ordering clinician or representative by the Radiologist Assistant, and communication documented in the PACS or zVision Dashboard. Electronically Signed   By: Mitzi Hansen M.D.   On: 12/31/2016 22:14   Dg Chest Portable 1 View  Result Date: 12/31/2016 CLINICAL DATA:  Altered mental status. EXAM: PORTABLE CHEST 1 VIEW COMPARISON:  Radiographs of May 29, 2006. FINDINGS: Stable cardiomediastinal silhouette. Atherosclerosis of thoracic aorta is noted. No pneumothorax is noted. Right lung is clear. Mild left midlung subsegmental atelectasis or scarring is noted. Degenerative changes seen involving the left glenohumeral joint. IMPRESSION: Aortic atherosclerosis. Mild left midlung subsegmental atelectasis or scarring. Electronically Signed   By: Lupita Raider, M.D.   On: 12/31/2016 12:20   Dg Hip Unilat W Or Wo Pelvis 2-3 Views Left  Result Date: 12/31/2016 CLINICAL DATA:  Altered mental status. Patient is complaining of left hip pain. EXAM: DG HIP (WITH OR WITHOUT PELVIS) 2-3V LEFT COMPARISON:  Scout radiograph from an abdominal and pelvic CT scan of May 27, 2006. FINDINGS: The bony pelvis is osteopenic. There are no acute pelvic fractures. There are degenerative changes of the lower lumbar spine. There is narrowing of the hip joints bilaterally. AP and lateral views of the left hip reveal no acute fracture or dislocation. The soft tissues are unremarkable. IMPRESSION: Moderate degenerative narrowing of the left hip joint. No acute left hip or pelvic fracture. Degenerative changes of the lower lumbar spine. Abdominal aortic atherosclerosis. Electronically Signed   By: David  [place] M.D.   On: 12/31/2016 14:14     ASSESSMENT AND PLAN:   * Acute left frontal and cerebellar CVA Echocardiogram and carotid Dopplers  pending. Appreciate neurology input.  Continue telemetry monitoring.  *Acute encephalopathy over baseline dementia.  Likely due to acute CVA.  Seems to have poor prognosis decrease oral intake.  Will wait for speech therapy evaluation.  If unable to meet nutritional requirements will be a candidate for hospice home. Palliative care consulted for goals of care.  *Hypernatremia due to dehydration.  Start half-normal saline.  All the records are reviewed and case discussed with Care Management/Social Worker Management plans discussed with the patient, family and they are in agreement.  CODE STATUS: FULL CODE  DVT Prophylaxis: SCDs  TOTAL TIME TAKING CARE OF THIS PATIENT: 30 minutes.   POSSIBLE D/C IN 1-2 DAYS, DEPENDING ON CLINICAL CONDITION.  Molinda BailiffSrikar R Bubba Vanbenschoten M.D on 01/01/2017 at 2:01 PM  Between 7am to 6pm - Pager - [phone]  After 6pm go to [email or web address] - password [email or web address]  [facility] [place] [facility]  Office  [phone]  CC: Primary care physician; Dorothey BasemanBronstein, David, MD  Note: This dictation was prepared with Dragon dictation along with smaller phrase technology. Any transcriptional errors that result from this process are unintentional.

## 2017-01-01 NOTE — Evaluation (Signed)
Clinical/Bedside Swallow Evaluation Patient Details  Name: Cristina Coleman MRN: [id number] Date of Birth: 02/23/1924  Today's Date: 01/01/2017 Time: SLP Start Time (ACUTE ONLY): 1030 SLP Stop Time (ACUTE ONLY): 1130 SLP Time Calculation (min) (ACUTE ONLY): 60 min  Past Medical History:  Past Medical History:  Diagnosis Date  . Hyperlipidemia   . Hypertension   . Osteoporosis   . Thyroid disease    Past Surgical History:  Past Surgical History:  Procedure Laterality Date  . KNEE SURGERY    . TUBAL LIGATION     HPI:  Pt is a 83 y.o. female with a known history of B12 deficiency, chronic bilateral subdural hematomas, hypertension and hypothyroidism who presents with concerns of lethargy, confusion and poor by mouth intake. Unsure of pt's baseline Cognitive status at home. Patient was seen by her primary care physician on the 17th for similar complaints. Family/pt actually saw primary care physician about 9 days ago with similar complaints. Over the past week to 2 weeks patient has had a decline intermittent mental status as well as poor by mouth intake. At her baseline she does ambulate with a walker some or sometimes uses a wheelchair and is able to feed herself but unsure of the complete independence of this d/t no family present to ask. Pt mostly nonverbal w/ SLP and did not follow commands consistently when asked. Per MRI, Severe chronic microvascular ischemic changes and severe parenchymal volume loss of the brain.    Assessment / Plan / Recommendation Clinical Impression  Pt appears to present w/ adequate oropharyngeal phase swallow function and tolerated trials of thin liquids and purees w/ no overt s/s of aspiration noted. However, pt required moderate verbal/tactile and visual cues to accept po trials and participate in feeding tasks. Once trials commenced, pt opened mouth promptly to accept the next trial. Pt consumed trials w/ no overt coughing or decline in respiratory  presentation during/post trials. Pt's oral strength and ROM appeared wfl for bolus management w/ no anterior loss noted and full oral clearing b/t trials during feeding. Pt required 100% feeding assistance w/ cues; and monitoring during feeding. Due to acuity of illness at this time and pt's (suspected) baseline Cognitive decline, recommend a modified diet of puree, and thin liquids. Recommend supervision and feeding support at all meals. If pt's medical and Cognitive status' improved and pt would like trials to upgrade diet consistency once discharged home or to SNF, pt could be followed by ST services there for safe upgrade. Recommend Pills in Puree - Crushed as able for safer, easier swallowing d/t Cognitive status. (NSG was able to give po's crushed in puree w/ SLP present; swallowed appropriately). NSG and Dietician updated. Noted Palliative Care f/u w/ family for goals of care today.  SLP Visit Diagnosis: Dysphagia, oropharyngeal phase (R13.12)    Aspiration Risk  (reduced w/ aspiration precautions; but Cognitive decline)    Diet Recommendation  Dysphagia level 1(puree) w/ Thin liquids; general aspiration precautions; supervision at meals for feeding assistance.   Medication Administration: Crushed with puree(for safer, easier swallowing d/t Cognitive decline)    Other  Recommendations Recommended Consults: (Dietician f/u) Oral Care Recommendations: Oral care BID;Staff/trained caregiver to provide oral care   Follow up Recommendations (TBD post discharge home/SNF if indicated)      Frequency and Duration (n/a)  (n/a)       Prognosis Prognosis for Safe Diet Advancement: Fair(-Good) Barriers to Reach Goals: Cognitive deficits;Severity of deficits      Swallow Study  General Date of Onset: 12/31/16 HPI: Pt is a 83 y.o. female with a known history of B12 deficiency, chronic bilateral subdural hematomas, hypertension and hypothyroidism who presents with concerns of lethargy, confusion  and poor by mouth intake. Unsure of pt's baseline Cognitive status at home. Patient was seen by her primary care physician on the 17th for similar complaints. Family/pt actually saw primary care physician about 9 days ago with similar complaints. Over the past week to 2 weeks patient has had a decline intermittent mental status as well as poor by mouth intake. At her baseline she does ambulate with a walker some or sometimes uses a wheelchair and is able to feed herself but unsure of the complete independence of this d/t no family present to ask. Pt mostly nonverbal w/ SLP and did not follow commands consistently when asked. Per MRI, Severe chronic microvascular ischemic changes and severe parenchymal volume loss of the brain.  Type of Study: Bedside Swallow Evaluation Previous Swallow Assessment: none reported Diet Prior to this Study: (unknown) Temperature Spikes Noted: No(wbc 9.3) Respiratory Status: Nasal cannula(2 liters) History of Recent Intubation: No Behavior/Cognition: Confused;Distractible;Requires cueing;Doesn't follow directions(cooperative w/ persistent cues; awake; nonverbal mostly) Oral Cavity Assessment: (difficult to assess d/t Cognitive status but appeared wfl) Oral Care Completed by SLP: Recent completion by staff Oral Cavity - Dentition: Edentulous(has upper denture plate in cup) Vision: (n/a) Self-Feeding Abilities: Total assist(did not attempt to feed self) Patient Positioning: Upright in bed Baseline Vocal Quality: Normal(when she spoke x2-3) Volitional Cough: Cognitively unable to elicit Volitional Swallow: Unable to elicit    Oral/Motor/Sensory Function Overall Oral Motor/Sensory Function: (appeared wfl for bolus management (ROM/strength))   Ice Chips Ice chips: Impaired Presentation: Spoon(fed; 3 trials) Oral Phase Impairments: Poor awareness of bolus(presented anteriorly) Oral Phase Functional Implications: (none) Pharyngeal Phase Impairments: (none)   Thin Liquid  Thin Liquid: Within functional limits Presentation: Straw(fed; ~6 ozs total) Oral Phase Impairments: Poor awareness of bolus(at first to open mouth to receive bolus but then wfl) Oral Phase Functional Implications: (none) Pharyngeal  Phase Impairments: (none) Other Comments: needed verbal/tactile and visual cues to participate    Nectar Thick Nectar Thick Liquid: Within functional limits Presentation: Straw(fed; ~2 ozs) Other Comments: same as w/ thin liquid trials - needed verbal/tactile and visual cues to participate   Honey Thick Honey Thick Liquid: Not tested   Puree Puree: Within functional limits Presentation: Spoon(fed; 10 trials) Other Comments: same as w/ liquids - needed verbal/tactile and visual cues to participate   Solid   GO   Solid: Not tested Other Comments: not assessed d/t Cognitive decilne and general status presentation at this time    Functional Assessment Tool Used: clinical judgement Functional Limitations: Swallowing Swallow Current Status ([id number]([id number]): At least 20 percent but less than 40 percent impaired, limited or restricted Swallow Goal Status [phone]([id number]): At least 20 percent but less than 40 percent impaired, limited or restricted Swallow Discharge Status [phone](G8998): At least 20 percent but less than 40 percent impaired, limited or restricted    Jerilynn SomKatherine Watson, MS, CCC-SLP Watson,Katherine 01/01/2017,1:46 PM

## 2017-01-01 NOTE — Plan of Care (Signed)
  Education: Knowledge of General Education information will improve 01/01/2017 0432 - Not Progressing by Geri SeminoleJackson, Analyssa Downs A, RN   Health Behavior/Discharge Planning: Ability to manage health-related needs will improve 01/01/2017 0432 - Not Progressing by Geri SeminoleJackson, Chidiebere Wynn A, RN   Clinical Measurements: Ability to maintain clinical measurements within normal limits will improve 01/01/2017 0432 - Not Progressing by Geri SeminoleJackson, Dannisha Eckmann A, RN   Education: Knowledge of disease or condition will improve 01/01/2017 0432 - Not Progressing by Geri SeminoleJackson, Tel Hevia A, RN Knowledge of secondary prevention will improve 01/01/2017 0432 - Not Progressing by Geri SeminoleJackson, Mohan Erven A, RN Knowledge of patient specific risk factors addressed and post discharge goals established will improve 01/01/2017 0432 - Not Progressing by Geri SeminoleJackson, Ji Fairburn A, RN   Coping: Will verbalize positive feelings about self 01/01/2017 0432 - Not Progressing by Geri SeminoleJackson, Celes Dedic A, RN Will identify appropriate support needs 01/01/2017 0432 - Not Progressing by Geri SeminoleJackson, Deryck Hippler A, RN   Self-Care: Ability to participate in self-care as condition permits will improve 01/01/2017 0432 - Not Progressing by Geri SeminoleJackson, Yolette Hastings A, RN Ability to communicate needs accurately will improve 01/01/2017 0432 - Not Progressing by Geri SeminoleJackson, Mirakle Tomlin A, RN   Nutrition: Risk of aspiration will decrease 01/01/2017 0432 - Not Progressing by Geri SeminoleJackson, Caylei Sperry A, RN   Ischemic Stroke/TIA Tissue Perfusion: Complications of ischemic stroke/TIA will be minimized 01/01/2017 0432 - Not Progressing by Geri SeminoleJackson, Syaire Saber A, RN

## 2017-01-01 NOTE — Progress Notes (Signed)
OT Cancellation Note  Patient Details Name: Cristina Coleman MRN: [id number] DOB: 11-28-1924   Cancelled Treatment:    Reason Eval/Treat Not Completed: Other (comment). Spoke with palliative care NP following meeting with pt's family. Pt no longer appropriate for therapy evaluation. Will complete order.    [name], MPH, MS, OTR/L ascom [phone] 01/01/17, 2:05 PM

## 2017-01-01 NOTE — Progress Notes (Signed)
Nutrition Brief Note  Patient identified to be seen for malnutrition screening tool (MST). Family reports patient has not been eating for over one week now. PO 0% here. They report her UBW was 140 lbs and she has been losing weight. Dysphagia 1 diet with thin liquids was recommended per SLP.  Chart reviewed following RD assessment. Pt now transitioning to comfort care. No nutrition interventions warranted at this time. Please consult RD as needed.   Helane RimaLeanne Chanler Mendonca, MS, RD, LDN Office: [phone] Pager: [phone] After Hours/Weekend Pager: [phone]

## 2017-01-02 MED ORDER — POLYVINYL ALCOHOL 1.4 % OP SOLN: 1.0000 [drp] | Freq: Four times a day (QID) | OPHTHALMIC | 0 refills | Status: AC | PRN

## 2017-01-02 MED ORDER — LORAZEPAM 2 MG/ML PO CONC: 1.0000 mg | ORAL | 0 refills | Status: AC | PRN

## 2017-01-02 MED ORDER — GLYCOPYRROLATE 1 MG PO TABS: 1.0000 mg | ORAL_TABLET | ORAL | Status: AC | PRN

## 2017-01-02 MED ORDER — MORPHINE SULFATE (CONCENTRATE) 10 MG/0.5ML PO SOLN: 5.0000 mg | ORAL | 0 refills | Status: AC | PRN

## 2017-01-02 NOTE — Plan of Care (Signed)
  Education: Knowledge of the prescribed therapeutic regimen will improve 01/02/2017 0248 - Not Progressing by Geri SeminoleJackson, Panzy Bubeck A, RN   Coping: Ability to identify and develop effective coping behavior will improve 01/02/2017 0248 - Not Progressing by Geri SeminoleJackson, Meryn Sarracino A, RN   Clinical Measurements: Quality of life will improve 01/02/2017 0248 - Progressing by Geri SeminoleJackson, Ameisha Mcclellan A, RN   Respiratory: Verbalizations of increased ease of respirations will increase 01/02/2017 0248 - Progressing by Geri SeminoleJackson, Kodiak Rollyson A, RN   Role Relationship: Family's ability to cope with current situation will improve 01/02/2017 0248 - Adequate for Discharge by Geri SeminoleJackson, Damaris Geers A, RN Ability to verbalize concerns, feelings, and thoughts to partner or family member will improve 01/02/2017 0248 - Not Progressing by Geri SeminoleJackson, Tatijana Bierly A, RN   Pain Management: Satisfaction with pain management regimen will improve 01/02/2017 0248 - Progressing by Geri SeminoleJackson, Jessa Stinson A, RN

## 2017-01-02 NOTE — Care Management Important Message (Signed)
Important Message  Patient Details  Name: Cristina Coleman MRN: [id number] Date of Birth: 1924/09/30   Medicare Important Message Given:  Yes    Gwenette GreetBrenda S Preslee Regas, RN 01/02/2017, 6:56 AM

## 2017-01-02 NOTE — Progress Notes (Signed)
[facility] medically stable [facility]. RN and MD aware of above. Clinical Child psychotherapistocial Worker (CSW) prepared D/C packet including DNR. Please reconsult if future social work needs arise. CSW signing off.   Baker Hughes IncorporatedBailey Jayleigh Notarianni, LCSW [phone]

## 2017-01-02 NOTE — Progress Notes (Addendum)
Daily Progress Note   Patient Name: Cristina Coleman       Date: 01/02/2017 DOB: [birth year]  Age: 83 y.o. MRN#: [id number] Attending Physician: Adrian Saran, MD Primary Care Physician: Dorothey Baseman, MD Admit Date: 12/31/2016  Reason for Consultation/Follow-up: Establishing goals of care  Subjective: Cristina Coleman is resting in bed. Non-verbal. No distress noted. Per nursing, Cristina Coleman became agitated overnight with being turned for cleaning. Encouraged nursing to use medications available for symptoms.   Length of Stay: 2  Current Medications: Scheduled Meds:  . aspirin  300 mg Rectal Daily  . lisinopril  40 mg Oral Daily  . sodium chloride flush  3 mL Intravenous Q12H    Continuous Infusions: . sodium chloride      PRN Meds: sodium chloride, acetaminophen **OR** acetaminophen, antiseptic oral rinse, glycopyrrolate **OR** glycopyrrolate **OR** glycopyrrolate, haloperidol **OR** haloperidol **OR** haloperidol lactate, LORazepam **OR** LORazepam **OR** LORazepam, morphine CONCENTRATE **OR** morphine CONCENTRATE, ondansetron **OR** ondansetron (ZOFRAN) IV, polyvinyl alcohol, senna-docusate, sodium chloride flush  Physical Exam  Constitutional: No distress.  Pulmonary/Chest: Effort normal.  Neurological: Cristina Coleman is alert.  Skin: Skin is warm and dry.            Vital Signs: BP 136/77 (BP Location: Right Arm)   Pulse (!) 106   Temp 98.2 F (36.8 C) (Axillary)   Resp 18   Ht  (1.549 m)   Wt 59.6 kg (131 lb 6.4 oz)   SpO2 96%   BMI 24.83 kg/m  SpO2: SpO2: 96 % O2 Device: O2 Device: Not Delivered O2 Flow Rate: O2 Flow Rate (L/min): 2 L/min  Intake/output summary:   Intake/Output Summary (Last 24 hours) at 01/02/2017 0908 Last data filed at 01/01/2017 1300 Gross per 24 hour   Intake 0 ml  Output -  Net 0 ml   LBM: Last BM Date: 12/30/16 Baseline Weight: Weight: 61.2 kg (135 lb) Most recent weight: Weight: 59.6 kg (131 lb 6.4 oz)       Palliative Assessment/Data: 10%      Patient Active Problem List   Diagnosis Date Noted  . Pressure injury of skin 01/01/2017  . Weakness 12/31/2016  . Symptomatic anemia 11/05/2015    Palliative Care Assessment & Plan   Patient Profile:  Cristina Townsel Pattersonis an 83 y.o.femalewho is unable to provide any history today.  Family unavailable therefore all history obtained from the chart. Patient brought to the EDwithconcerns oflethargy, confusion and poororalintake.   Assessment: Ms. Cristina Mottoatterson is resting in bed.   Recommendations/Plan:  Inpatient hospice.   Goals of Care and Additional Recommendations:  Limitations on Scope of Treatment: Full Comfort Care  Code Status:    Code Status Orders  (From admission, onward)        Start     Ordered   01/01/17 1432  Do not attempt resuscitation (DNR)  Continuous    Question Answer Comment  In the event of cardiac or respiratory ARREST Do not call a "code blue"   In the event of cardiac or respiratory ARREST Do not perform Intubation, CPR, defibrillation or ACLS   In the event of cardiac or respiratory ARREST Use medication by any route, position, wound care, and other measures to relive pain and suffering. May use oxygen, suction and manual treatment of airway obstruction as needed for comfort.      01/01/17 1432    Code Status History    Date Active Date Inactive Code Status Order ID Comments User Context   12/31/2016 15:00 01/01/2017 14:32 Full Code [id number]  Adrian SaranMody, Sital, MD ED   11/05/2015 20:25 11/06/2015 13:35 Full Code [id number]  Alford HighlandWieting, Richard, MD ED       Prognosis:   < 2 weeks  Discharge Planning:  Hospice facility  Care plan was discussed with primary RN  Thank you for allowing the Palliative Medicine Team to assist in the  care of this patient.   Time In: 8:50 Time Out: 9:15 Total Time 25 min Prolonged Time Billed  No       Greater than 50%  of this time was spent counseling and coordinating care related to the above assessment and plan.  Cristina Stallrystal Aubriegh Minch, NP  Please contact Palliative Medicine Team phone at [phone] for questions and concerns.

## 2017-01-02 NOTE — Progress Notes (Signed)
Pt discharged via [facility]emergent [facility] to [facility]. Pt in no distress on room air. Otilio JeffersonMadelyn S Fenton, RN

## 2017-01-02 NOTE — Discharge Summary (Signed)
[facility] [place] [facility]   PATIENT NAME: Cristina Coleman    MR#:  [id number]  DATE OF BIRTH:  1924/08/28  DATE OF ADMISSION:  12/31/2016 ADMITTING PHYSICIAN: Adrian Saran, MD  DATE OF DISCHARGE: 01/02/2017  PRIMARY CARE PHYSICIAN: Dorothey Baseman, MD    ADMISSION DIAGNOSIS:  Altered mental status, unspecified altered mental status type [R41.82]  DISCHARGE DIAGNOSIS:  Active Problems:   Weakness   Pressure injury of skin CVA  SECONDARY DIAGNOSIS:   Past Medical History:  Diagnosis Date  . Hyperlipidemia   . Hypertension   . Osteoporosis   . Thyroid disease     HOSPITAL COURSE:   83 year old female with dementia who presented with decreased by mouth intake and lethargy  1. Acute left frontal and cerebral CVA: Patient will be discharged to hospice  2. Acute encephalopathy  3. Dementia  4. Hypernatremia  Patient has overall very poor prognosis. Family has decided on hospice facility   DISCHARGE CONDITIONS AND DIET:   Guarded condition diet as tolerated  CONSULTS OBTAINED:  Treatment Team:  Thana Farr, MD  DRUG ALLERGIES:  No Known Allergies  DISCHARGE MEDICATIONS:   Allergies as of 01/02/2017   No Known Allergies     Medication List    STOP taking these medications   atorvastatin 20 MG tablet Commonly known as:  LIPITOR   divalproex 125 MG capsule Commonly known as:  DEPAKOTE SPRINKLE   ferrous sulfate 325 (65 FE) MG EC tablet   hydrochlorothiazide 12.5 MG capsule Commonly known as:  MICROZIDE   levothyroxine 75 MCG tablet Commonly known as:  SYNTHROID, LEVOTHROID   lisinopril 40 MG tablet Commonly known as:  PRINIVIL,ZESTRIL     TAKE these medications   glycopyrrolate 1 MG tablet Commonly known as:  ROBINUL Take 1 tablet (1 mg total) by mouth every 4 (four) hours as needed (excessive secretions).   LORazepam 2 MG/ML concentrated solution Commonly known as:  ATIVAN Place 0.5 mLs (1 mg total) under  the tongue every 4 (four) hours as needed for anxiety.   morphine CONCENTRATE 10 MG/0.5ML Soln concentrated solution Take 0.25 mLs (5 mg total) by mouth every 2 (two) hours as needed for moderate pain (or dyspnea).   polyvinyl alcohol 1.4 % ophthalmic solution Commonly known as:  LIQUIFILM TEARS Place 1 drop into both eyes 4 (four) times daily as needed for dry eyes.   potassium chloride 10 MEQ CR capsule Commonly known as:  MICRO-K Take 1 capsule by mouth daily.   QUEtiapine 25 MG tablet Commonly known as:  SEROQUEL Take 1 tablet by mouth 2 (two) times daily.         Today   CHIEF COMPLAINT:  Cristina Coleman has decided on hospice home   VITAL SIGNS:  Blood pressure 136/77, pulse (!) 106, temperature 98.2 F (36.8 C), temperature source Axillary, resp. rate 18, height 5\' 1"  (1.549 m), weight 59.6 kg (131 lb 6.4 oz), SpO2 96 %.   REVIEW OF SYSTEMS:  Review of Systems  Unable to perform ROS: Dementia     PHYSICAL EXAMINATION:  GENERAL:  83 y.o.-year-old patient lying in the bed with no acute distress. Critically ill-appearing NECK:  Supple, no jugular venous distention. No thyroid enlargement, no tenderness.  LUNGS: Normal breath sounds bilaterally, no wheezing, rales,rhonchi  No use of accessory muscles of respiration.  CARDIOVASCULAR: S1, S2 normal. No murmurs, rubs, or gallops.  ABDOMEN: Soft, non-tender, non-distended. Bowel sounds present. No organomegaly or mass.  EXTREMITIES: No pedal edema, cyanosis,  or clubbing.  PSYCHIATRIC: Lethargic does not follow any commands  SKIN: No obvious rash, lesion, or ulcer.   DATA REVIEW:   CBC Recent Labs  Lab 01/01/17 0422  WBC 9.3  HGB 14.3  HCT 45.3  PLT 82*    Chemistries  Recent Labs  Lab 12/31/16 1225 01/01/17 0422  NA 146* 149*  K 3.5 3.9  CL 111 117*  CO2 27 23  GLUCOSE 117* 81  BUN 66* 54*  CREATININE 1.22* 0.88  CALCIUM 11.3* 10.8*  AST 17  --   ALT 12*  --   ALKPHOS 87  --   BILITOT 0.9  --      Cardiac Enzymes Recent Labs  Lab 12/31/16 1225  TROPONINI 0.04*    Microbiology Results  @MICRORSLT48 @  RADIOLOGY:  Ct Head Wo Contrast  Result Date: 12/31/2016 CLINICAL DATA:  Altered mental status with decreased awareness EXAM: CT HEAD WITHOUT CONTRAST TECHNIQUE: Contiguous axial images were obtained from the base of the skull through the vertex without intravenous contrast. COMPARISON:  November 26, 2015 FINDINGS: Brain: There is moderate diffuse atrophy. There has been interval resolution of previous extra-axial fluid collections. There is no appreciable intracranial mass, hemorrhage, extra-axial fluid collection, or midline shift. There is extensive small vessel disease throughout the centra semiovale bilaterally. There is small vessel disease throughout both basal ganglia regions with decreased attenuation in both internal and external capsules as well as in each lentiform nucleus. There is evidence of a prior small infarct in the posterior head of the caudate nucleus on the left. There is evidence of a prior infarct in the left thalamus. No acute infarct is demonstrated on this study. Vascular: There is calcification in each carotid siphon region as well as in the distal right middle cerebral artery, stable. Skull: The bony calvarium appears intact. There is a small right frontal-temporal junction enostosis without edema, a benign and stable finding. Sinuses/Orbits: There is mucosal thickening in several ethmoid air cells bilaterally. There is opacification of an ethmoid air cell on each side. Other visualized paranasal sinuses are clear. Orbits appear symmetric bilaterally. Other: Mastoid air cells are clear. IMPRESSION: Atrophy with extensive supratentorial small vessel disease. No acute infarct evident. No mass or hemorrhage. Interval resolution of extra-axial fluid collections compared to prior study. Foci of arterial vascular calcification noted. Areas of ethmoid sinus disease noted.  Electronically Signed   By: Bretta Bang III M.D.   On: 12/31/2016 12:36   Mr Brain Wo Contrast  Result Date: 12/31/2016 CLINICAL DATA:  83 y/o F; 4 days of progressive altered mental status. EXAM: MRI HEAD WITHOUT CONTRAST TECHNIQUE: Multiplanar, multiecho pulse sequences of the brain and surrounding structures were obtained without intravenous contrast. COMPARISON:  12/31/2016 CT of the head. FINDINGS: Brain: 5 mm foci of reduced diffusion are present in the left frontal subcortical white matter in the left inferior cerebellar hemisphere (series 100, image 14 and 41) compatible with acute/ early subacute infarction. No associated hemorrhage or mass effect. Confluent nonspecific foci of T2 FLAIR hyperintense signal abnormality in subcortical and periventricular white matter are compatible with severe chronic microvascular ischemic changes for age. Severe brain parenchymal volume loss. There are a few punctate foci of susceptibility hypointensity in the right cerebellar hemisphere in bilateral cerebral hemispheres compatible with chronic hemosiderin deposition of microhemorrhage. There are several very small chronic infarctions in the cerebellar hemispheres bilaterally and chronic lacunar infarctions within bilateral thalamus and caudate nuclei. No extra-axial collection or hydrocephalus. Vascular: Normal flow voids. Skull and  upper cervical spine: Normal marrow signal. Sinuses/Orbits: Mild diffuse paranasal sinus mucosal thickening. Bilateral intra-ocular lens replacement. No abnormal signal of mastoid air cells. Other: None. IMPRESSION: 1. 5 mm acute/early subacute small vessel infarctions are present within left cerebellar hemisphere and left frontal subcortical white matter. No associated hemorrhage or mass effect. 2. Severe chronic microvascular ischemic changes and severe parenchymal volume loss of the brain. 3. Mild diffuse paranasal sinus disease. These results will be called to the ordering  clinician or representative by the Radiologist Assistant, and communication documented in the PACS or zVision Dashboard. Electronically Signed   By: Mitzi HansenLance  Furusawa-Stratton M.D.   On: 12/31/2016 22:14   Dg Chest Portable 1 View  Result Date: 12/31/2016 CLINICAL DATA:  Altered mental status. EXAM: PORTABLE CHEST 1 VIEW COMPARISON:  Radiographs of May 29, 2006. FINDINGS: Stable cardiomediastinal silhouette. Atherosclerosis of thoracic aorta is noted. No pneumothorax is noted. Right lung is clear. Mild left midlung subsegmental atelectasis or scarring is noted. Degenerative changes seen involving the left glenohumeral joint. IMPRESSION: Aortic atherosclerosis. Mild left midlung subsegmental atelectasis or scarring. Electronically Signed   By: Lupita RaiderJames  Green Jr, M.D.   On: 12/31/2016 12:20   Dg Hip Unilat W Or Wo Pelvis 2-3 Views Left  Result Date: 12/31/2016 CLINICAL DATA:  Altered mental status. Patient is complaining of left hip pain. EXAM: DG HIP (WITH OR WITHOUT PELVIS) 2-3V LEFT COMPARISON:  Scout radiograph from an abdominal and pelvic CT scan of May 27, 2006. FINDINGS: The bony pelvis is osteopenic. There are no acute pelvic fractures. There are degenerative changes of the lower lumbar spine. There is narrowing of the hip joints bilaterally. AP and lateral views of the left hip reveal no acute fracture or dislocation. The soft tissues are unremarkable. IMPRESSION: Moderate degenerative narrowing of the left hip joint. No acute left hip or pelvic fracture. Degenerative changes of the lower lumbar spine. Abdominal aortic atherosclerosis. Electronically Signed   By: David  SwazilandJordan M.D.   On: 12/31/2016 14:14      Allergies as of 01/02/2017   No Known Allergies     Medication List    STOP taking these medications   atorvastatin 20 MG tablet Commonly known as:  LIPITOR   divalproex 125 MG capsule Commonly known as:  DEPAKOTE SPRINKLE   ferrous sulfate 325 (65 FE) MG EC tablet    hydrochlorothiazide 12.5 MG capsule Commonly known as:  MICROZIDE   levothyroxine 75 MCG tablet Commonly known as:  SYNTHROID, LEVOTHROID   lisinopril 40 MG tablet Commonly known as:  PRINIVIL,ZESTRIL     TAKE these medications   glycopyrrolate 1 MG tablet Commonly known as:  ROBINUL Take 1 tablet (1 mg total) by mouth every 4 (four) hours as needed (excessive secretions).   LORazepam 2 MG/ML concentrated solution Commonly known as:  ATIVAN Place 0.5 mLs (1 mg total) under the tongue every 4 (four) hours as needed for anxiety.   morphine CONCENTRATE 10 MG/0.5ML Soln concentrated solution Take 0.25 mLs (5 mg total) by mouth every 2 (two) hours as needed for moderate pain (or dyspnea).   polyvinyl alcohol 1.4 % ophthalmic solution Commonly known as:  LIQUIFILM TEARS Place 1 drop into both eyes 4 (four) times daily as needed for dry eyes.   potassium chloride 10 MEQ CR capsule Commonly known as:  MICRO-K Take 1 capsule by mouth daily.   QUEtiapine 25 MG tablet Commonly known as:  SEROQUEL Take 1 tablet by mouth 2 (two) times daily.  Stable for discharge hospice  Patient should follow up with hospice  CODE STATUS:     Code Status Orders  (From admission, onward)        Start     Ordered   01/01/17 1432  Do not attempt resuscitation (DNR)  Continuous    Question Answer Comment  In the event of cardiac or respiratory ARREST Do not call a "code blue"   In the event of cardiac or respiratory ARREST Do not perform Intubation, CPR, defibrillation or ACLS   In the event of cardiac or respiratory ARREST Use medication by any route, position, wound care, and other measures to relive pain and suffering. May use oxygen, suction and manual treatment of airway obstruction as needed for comfort.      01/01/17 1432    Code Status History    Date Active Date Inactive Code Status Order ID Comments User Context   12/31/2016 15:00 01/01/2017 14:32 Full Code  [id number]  Adrian SaranMody, Lamonte Hartt, MD ED   11/05/2015 20:25 11/06/2015 13:35 Full Code [id number]  Alford HighlandWieting, Richard, MD ED      TOTAL TIME TAKING CARE OF THIS PATIENT: 37 minutes.    Note: This dictation was prepared with Dragon dictation along with smaller phrase technology. Any transcriptional errors that result from this process are unintentional.  Gabrella Stroh M.D on 01/02/2017 at 11:38 AM  Between 7am to 6pm - Pager - [phone] After 6pm go to [email or web address] - Social research officer, governmentpassword [email or web address]  [facility]  Office  [phone]  CC: Primary care physician; Dorothey BasemanBronstein, David, MD

## 2017-01-02 NOTE — Progress Notes (Signed)
Follow up visit made to new referral for hospice home. Patient seen lying in bed, no longer following commands, right sided weakness noted. Per chart note review and staff RN report she has been more agitated with care. No PRN medications have been given, no oral medications this morning as patient was unable to take. Updated notes and discharge summary faxed to referral. Report called to the hospice home. EMS notified for transport. Hospital care team updated. Thank you. Dayna BarkerKaren Robertson RN, BSN, [facility] [place], hospital liaison [phone]

## 2017-02-06 DEATH — deceased

## 2018-05-24 IMAGING — CT CT HEAD W/O CM
5 of 8 series · 15 of 47 positions shown, 16 images · non-contrast
Comparison: November 26, 2015

CLINICAL DATA: Altered mental status with decreased awareness

EXAM:
CT HEAD WITHOUT CONTRAST
TECHNIQUE: Contiguous axial images were obtained from the base of the skull
through the vertex without intravenous contrast.

[Series 3: head wo · axial · 0.44mm/px · z∈[+425,+515]mm · 4 of 31 slices shown, 5 images]
[im 7/31  brain]
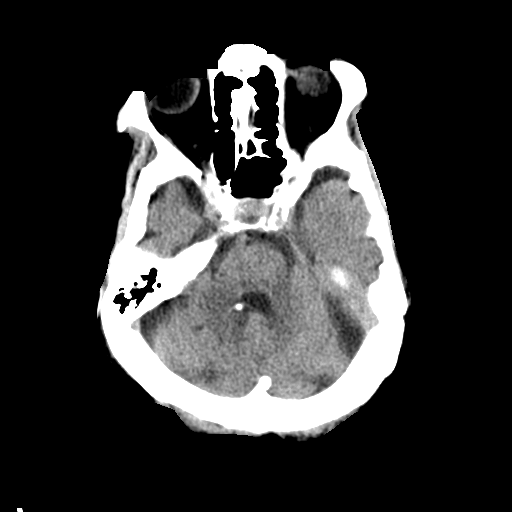
[im 7/31  bone]
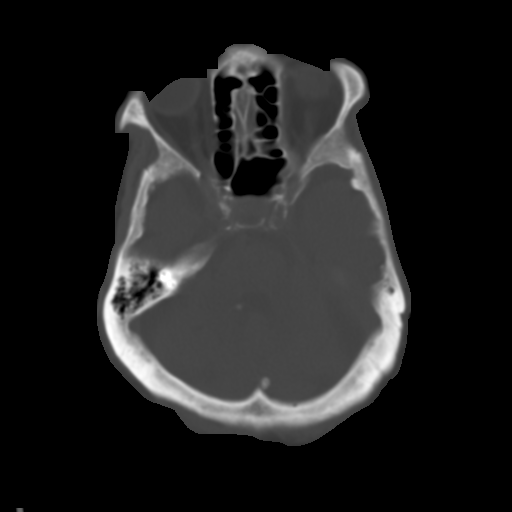
[im 13/31  brain]
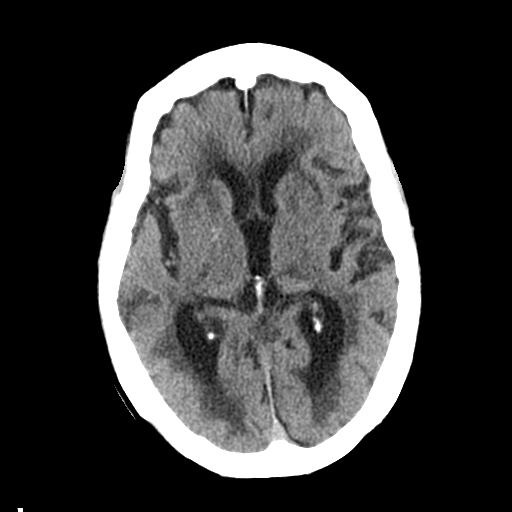
[im 19/31  brain]
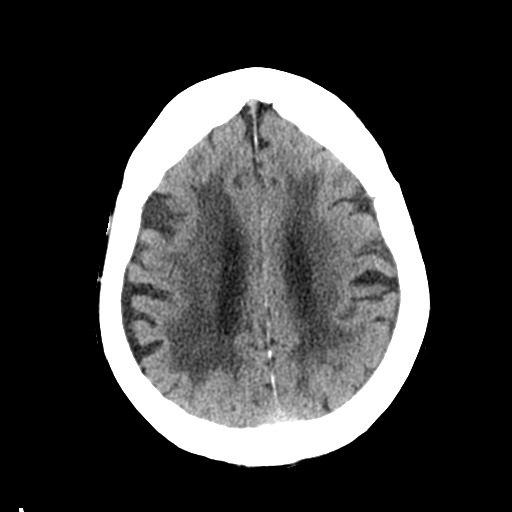
[im 25/31  brain]
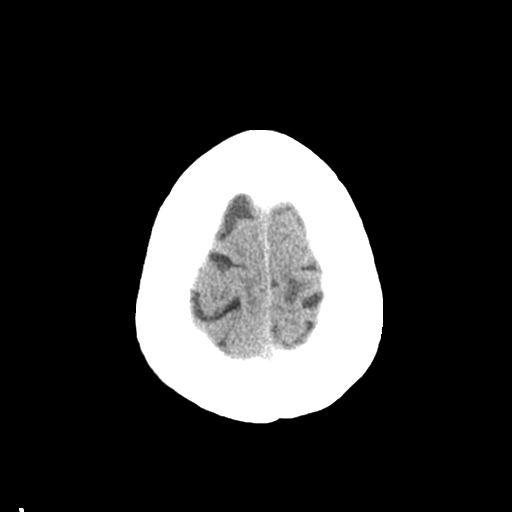

[Series 4: coronal soft tissue · coronal · 0.31mm/px · 3 of 62 slices shown]
[im 16/62  brain]
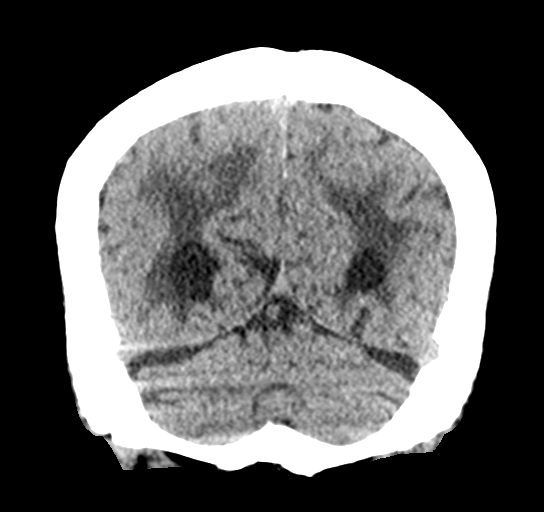
[im 31/62  brain]
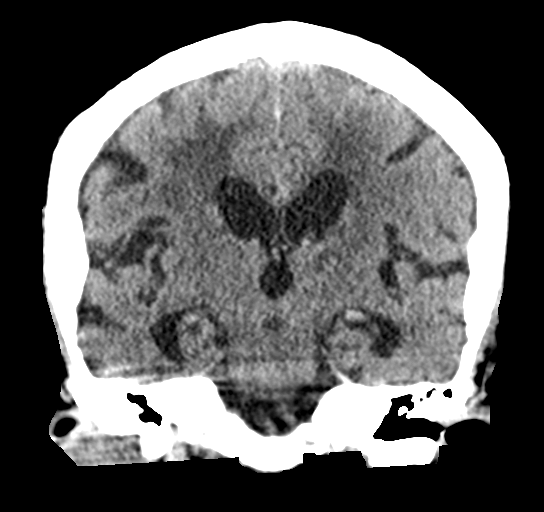
[im 46/62  brain]
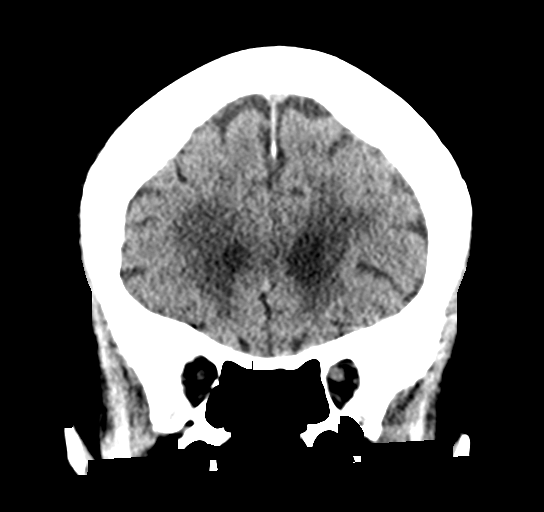

[Series 9: sagittal soft tissue · sagittal · 0.21mm/px · 2 of 48 slices shown]
[im 16/48  brain]
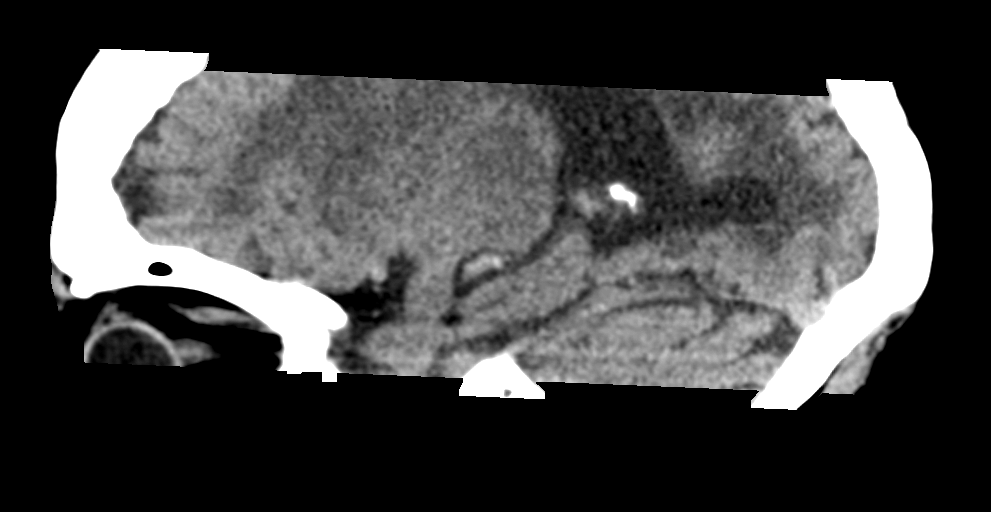
[im 32/48  brain]
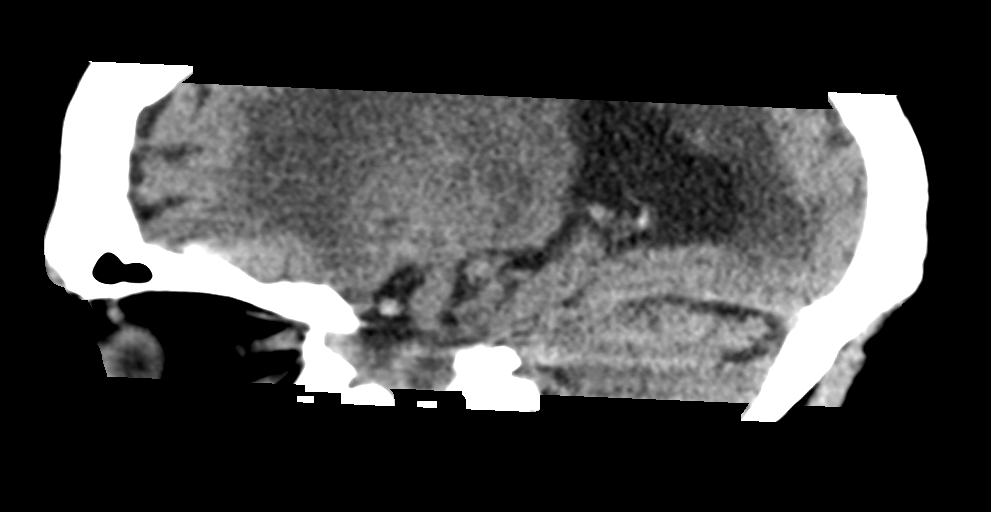

[Series 10: ax head wo · axial · 0.33mm/px · z∈[+421,+501]mm · 4 of 28 slices shown]
[im 6/28  brain]
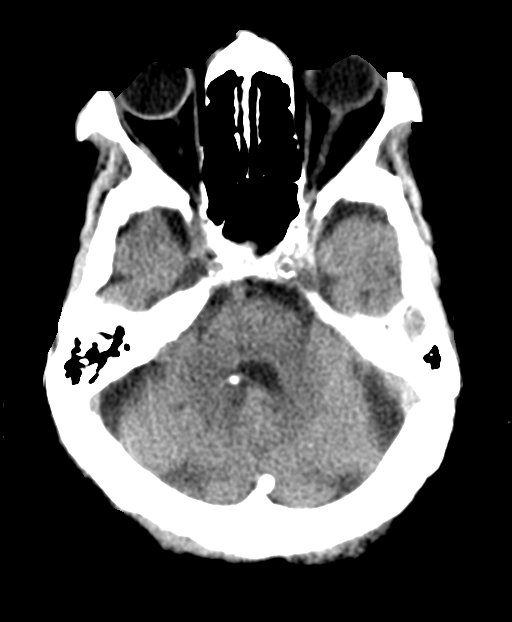
[im 11/28  brain]
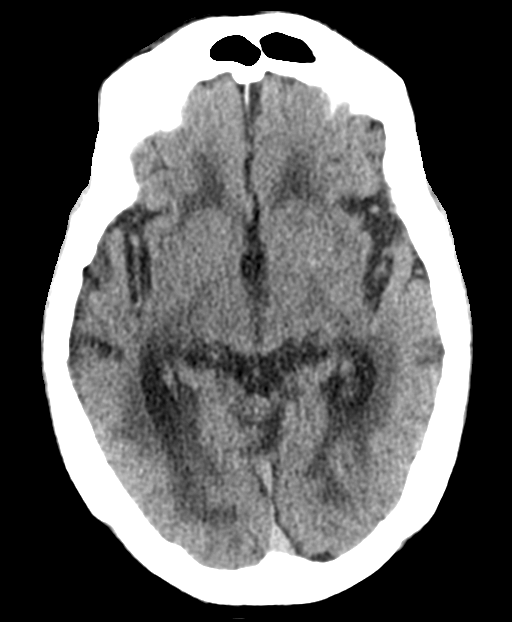
[im 17/28  brain]
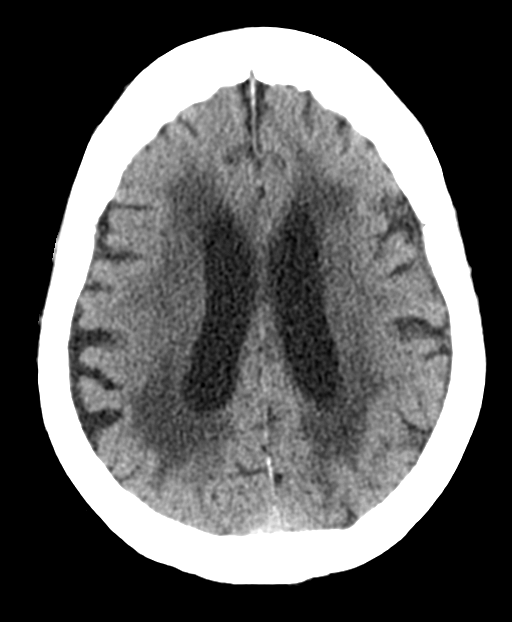
[im 22/28  brain]
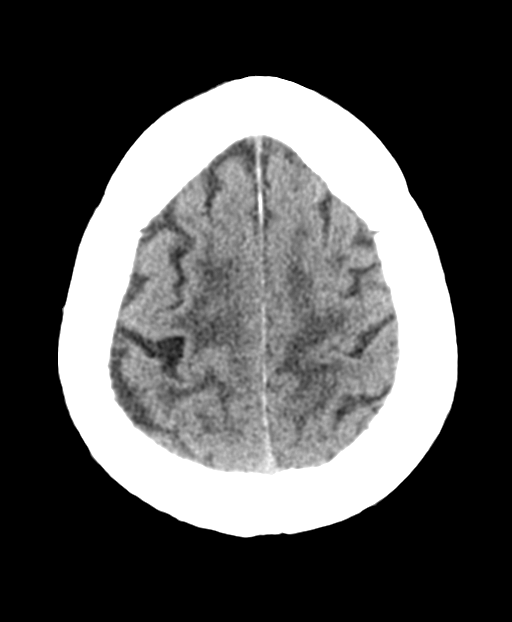

[Series 11: ax head bone · axial · 0.33mm/px · z∈[+401,+421]mm · 2 of 72 slices shown]
[im 6/72  bone]
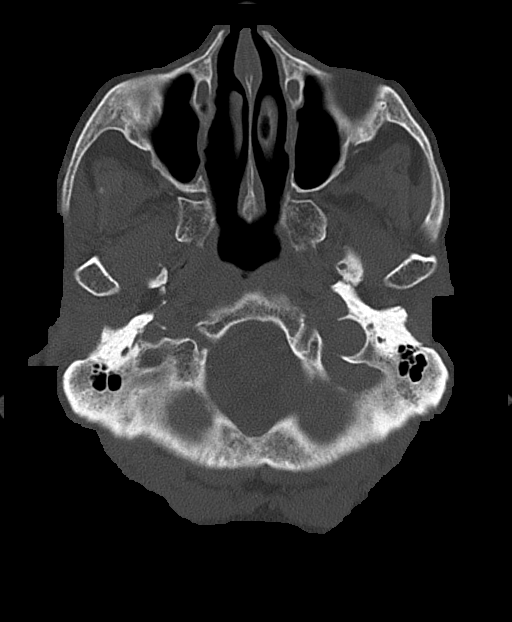
[im 16/72  bone]
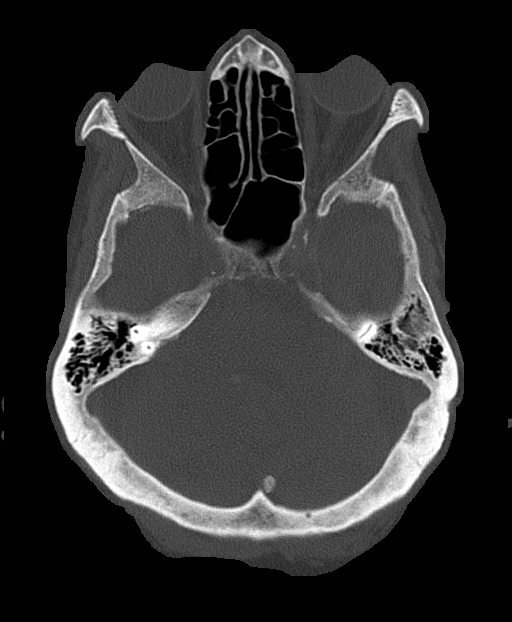

[15 of 47 positions shown; findings below may reference images not displayed]

FINDINGS: Brain: There is moderate diffuse atrophy. There has been interval
resolution of previous extra-axial fluid collections. There is no
appreciable intracranial mass, hemorrhage, extra-axial fluid
collection, or midline shift. There is extensive small vessel
disease throughout the centra semiovale bilaterally. There is small
vessel disease throughout both basal ganglia regions with decreased
attenuation in both internal and external capsules as well as in
each lentiform nucleus. There is evidence of a prior small infarct
in the posterior head of the caudate nucleus on the left. There is
evidence of a prior infarct in the left thalamus. No acute infarct
is demonstrated on this study.

Vascular: There is calcification in each carotid siphon region as
well as in the distal right middle cerebral artery, stable.

Skull: The bony calvarium appears intact. There is a small right
frontal-temporal junction enostosis without edema, a benign and
stable finding.

Sinuses/Orbits: There is mucosal thickening in several ethmoid air
cells bilaterally. There is opacification of an ethmoid air cell on
each side. Other visualized paranasal sinuses are clear. Orbits
appear symmetric bilaterally.

Other: Mastoid air cells are clear.
IMPRESSION: Atrophy with extensive supratentorial small vessel disease. No acute
infarct evident. No mass or hemorrhage. Interval resolution of
extra-axial fluid collections compared to prior study.

Foci of arterial vascular calcification noted. Areas of ethmoid
sinus disease noted.
